# Patient Record
Sex: Male | Born: 1960 | Race: Black or African American | Hispanic: No | Marital: Married | Smoking: Never smoker
Health system: Southern US, Community
[De-identification: ages and names within clinical notes are randomized; demographics above are authoritative.]

## PROBLEM LIST (undated history)

## (undated) DIAGNOSIS — J45909 Unspecified asthma, uncomplicated: Secondary | ICD-10-CM

## (undated) HISTORY — DX: Unspecified asthma, uncomplicated: J45.909

## (undated) HISTORY — PX: NO PAST SURGERIES: SHX2092

## (undated) HISTORY — PX: KNEE ARTHROSCOPY: SHX127

---

## 1998-07-02 ENCOUNTER — Emergency Department (HOSPITAL_COMMUNITY): Admission: EM | Admit: 1998-07-02 | Discharge: 1998-07-02 | Payer: Self-pay | Admitting: Emergency Medicine

## 1998-08-06 ENCOUNTER — Emergency Department (HOSPITAL_COMMUNITY): Admission: EM | Admit: 1998-08-06 | Discharge: 1998-08-06 | Payer: Self-pay | Admitting: Emergency Medicine

## 1999-08-28 ENCOUNTER — Emergency Department (HOSPITAL_COMMUNITY): Admission: EM | Admit: 1999-08-28 | Discharge: 1999-08-28 | Payer: Self-pay | Admitting: Internal Medicine

## 1999-09-30 ENCOUNTER — Emergency Department (HOSPITAL_COMMUNITY): Admission: EM | Admit: 1999-09-30 | Discharge: 1999-09-30 | Payer: Self-pay | Admitting: Emergency Medicine

## 2000-06-19 ENCOUNTER — Emergency Department (HOSPITAL_COMMUNITY): Admission: EM | Admit: 2000-06-19 | Discharge: 2000-06-20 | Payer: Self-pay | Admitting: Emergency Medicine

## 2010-09-27 ENCOUNTER — Emergency Department (HOSPITAL_COMMUNITY)
Admission: EM | Admit: 2010-09-27 | Discharge: 2010-09-27 | Disposition: A | Discharge status: Home / Self Care | Payer: 59 | Attending: Emergency Medicine | Admitting: Emergency Medicine

## 2010-09-27 DIAGNOSIS — R21 Rash and other nonspecific skin eruption: Secondary | ICD-10-CM | POA: Insufficient documentation

## 2010-09-27 DIAGNOSIS — J45909 Unspecified asthma, uncomplicated: Secondary | ICD-10-CM | POA: Insufficient documentation

## 2012-07-26 ENCOUNTER — Ambulatory Visit (INDEPENDENT_AMBULATORY_CARE_PROVIDER_SITE_OTHER): Payer: 59 | Admitting: Internal Medicine

## 2012-07-26 ENCOUNTER — Encounter: Payer: Self-pay | Admitting: Internal Medicine

## 2012-07-26 VITALS — BP 102/70 | HR 78 | Temp 98.6°F | Ht 68.0 in | Wt 165.8 lb

## 2012-07-26 DIAGNOSIS — J45909 Unspecified asthma, uncomplicated: Secondary | ICD-10-CM

## 2012-07-26 MED ORDER — LEVOFLOXACIN 500 MG PO TABS: 500.0000 mg | ORAL_TABLET | Freq: Every day | ORAL | Status: DC

## 2012-07-26 MED ORDER — PREDNISONE 10 MG PO TABS: ORAL_TABLET | ORAL | Status: DC

## 2012-07-26 NOTE — Progress Notes (Signed)
Subjective:    Patient ID: Thomas [name], male    DOB: [birth year], 51 y.o.   MRN: [id number]  HPI  PCP is Baxter Hire, MD  IOV 07/26/2012  51 year old male. 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 Follows with Dr Willa Rough. Referred here because per him "Breathing scores need to be better". Says that breathing test numbers are bad. He has been seeing her for a few years and per him spirometry numbers are fluctuant but never is normal even when he feels well. He says he has diagnosis of asthma since infancy. He is on chronic symbicort (160/4.5) atleast 2 years. He says is 100% compliant. Takes xopenex prn which he atleast needs once a day even a good day. 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 Asthma symptoms are cough, dyspnea and wheeze. Asthma triggered by weather change, cold air, rain, snow, freezing weather, pollen, dust, perfume but not by gasoline or roaches. Symptoms improved by relaxation and medication.  Overall asthma stable for 2 years. Risk status: last ER visit 1994. Never admitted, No ICU stay.    Current Rx is symbicort and xopenex. Last prednisone one year ago but at baseline will need prednisone on average 3 times. He is not on allergy shots but says is positive for ragweed, and "other things" (tested > 5 years ago).  Of note, when I reviewed Dr Willa Rough notes: I have learned that he is supposed to be on singulair but he never decleared that as his med. He has been advised xolair but he denied any offering of anti allergy meds by Dr Willa Rough. In fact. Dr Willa Rough was extremely concerned about his non compliance. However, he maintains he is compliant  Currently having some cough that he thinks sick contact from his son x 1 week. There is associated sputum but not coughing out. There is also associated wheeze, sneezing occasionally.  No associated fever  Spriometry - Moderate obstruction  fev1 1.98L/62%, Ratio 61   Dr Gretta Cool Reflux Symptom Index (> 13-15 suggestive of LPR cough) 07/26/2012    Hoarseness of problem with voice 0  Clearing  Of Throat 0  Excess throat mucus or feeling of post nasal drip 0  Difficulty swallowing food, liquid or tablets 0  Cough after eating or lying down 0  Breathing difficulties or choking episodes 0  Troublesome or annoying cough 3  Sensation of something sticking in throat or lump in throat 3  Heartburn, chest pain, indigestion, or stomach acid coming up 0  TOTAL 6     Past Medical History  Diagnosis Date  . Asthma      No family history on file.   History   Social History  . Marital Status: Married    Spouse Name: N/A    Number of Children: N/A  . Years of Education: N/A   Occupational History  . truck driver    Social History Main Topics  . Smoking status: Never Smoker   . Smokeless tobacco: Not on file  . Alcohol Use: No  . Drug Use: No  . Sexually Active: Not on file   Other Topics Concern  . Not on file   Social History Narrative  . No narrative on file     No Known Allergies   No outpatient prescriptions prior to visit.    Last reviewed on 07/26/2012  3:16 PM by Darrell Jewel, CMA  Review of Systems  Constitutional: Negative for fever and unexpected weight change.  HENT: Negative for ear pain, nosebleeds, congestion, sore throat, rhinorrhea, sneezing, trouble swallowing, dental problem, postnasal drip and sinus pressure.   Eyes: Negative for redness and itching.  Respiratory: Positive for cough and shortness of breath. Negative for chest tightness and wheezing.   Cardiovascular: Negative for palpitations and leg swelling.  Gastrointestinal: Negative for nausea and vomiting.  Genitourinary: Negative for dysuria.  Musculoskeletal: Negative for joint swelling.  Skin: Negative for rash.  Neurological: Negative for headaches.  Hematological: Does not bruise/bleed easily.   Psychiatric/Behavioral: Negative for dysphoric mood. The patient is not nervous/anxious.        Objective:   Physical Exam  Nursing note and vitals reviewed. Constitutional: He is oriented to person, place, and time. He appears well-developed and well-nourished. No distress.  HENT:  Head: Normocephalic and atraumatic.  Right Ear: External ear normal.  Left Ear: External ear normal.  Mouth/Throat: Oropharynx is clear and moist. No oropharyngeal exudate.  Eyes: Conjunctivae normal and EOM are normal. Pupils are equal, round, and reactive to light. Right eye exhibits no discharge. Left eye exhibits no discharge. No scleral icterus.  Neck: Normal range of motion. Neck supple. No JVD present. No tracheal deviation present. No thyromegaly present.  Cardiovascular: Normal rate, regular rhythm and intact distal pulses.  Exam reveals no gallop and no friction rub.   No murmur heard. Pulmonary/Chest: Effort normal and breath sounds normal. No respiratory distress. He has no wheezes. He has no rales. He exhibits no tenderness.       barrell chest  Abdominal: Soft. Bowel sounds are normal. He exhibits no distension and no mass. There is no tenderness. There is no rebound and no guarding.  Musculoskeletal: Normal range of motion. He exhibits no edema and no tenderness.  Lymphadenopathy:    He has no cervical adenopathy.  Neurological: He is alert and oriented to person, place, and time. He has normal reflexes. No cranial nerve deficit. Coordination normal.  Skin: Skin is warm and dry. No rash noted. He is not diaphoretic. No erythema. No pallor.  Psychiatric: He has a normal mood and affect. His behavior is normal. Judgment and thought content normal.          Assessment & Plan:

## 2012-07-26 NOTE — Patient Instructions (Addendum)
You might be having an asthma attack due to sick exposure from your son  take levaquin 500mg  once daily  X 6 days Take prednisone 40 mg daily x 2 days, then 20mg  daily x 2 days, then 10mg  daily x 2 days, then 5mg  daily x 2 days and stop Stop symbicort Instead take dulera 200/5, 2 puff twice daily Use xopenex as needed Return in 1 month with spirometry at followup Please have flu shot ASAP; respect your decision to refuse today

## 2012-07-28 DIAGNOSIS — J45909 Unspecified asthma, uncomplicated: Secondary | ICD-10-CM | POA: Insufficient documentation

## 2012-07-28 NOTE — Assessment & Plan Note (Addendum)
He has moderate persistent asthma with poor control. REferral notes speak of poor compliance which he steadfasthly denies though for sure he is a poor historian (he could not remember conversations about xolair with Dr Willa Rough and could not clearly state whey he is here). I will Rx him for possible acute asthma with antibiotic and short course prednisone. For long term maintenance, will start with switching from symbicort 160 (moderate ICS)  To a high dose ICS (he opted out of advair so we went with dulera 200). I will see him at followup with spirometry   I offered flu shot but he refused saying he has never had it in past. Explained flu can be fatal in asthmatic. He then said he will think about it and likely have flu shot at followup

## 2012-08-30 ENCOUNTER — Encounter: Payer: Self-pay | Admitting: Internal Medicine

## 2012-08-30 ENCOUNTER — Ambulatory Visit (INDEPENDENT_AMBULATORY_CARE_PROVIDER_SITE_OTHER): Payer: 59 | Admitting: Internal Medicine

## 2012-08-30 VITALS — BP 110/72 | HR 71 | Temp 98.2°F | Ht 68.0 in | Wt 176.6 lb

## 2012-08-30 DIAGNOSIS — B370 Candidal stomatitis: Secondary | ICD-10-CM

## 2012-08-30 DIAGNOSIS — J45909 Unspecified asthma, uncomplicated: Secondary | ICD-10-CM

## 2012-08-30 MED ORDER — MONTELUKAST SODIUM 10 MG PO TABS: 10.0000 mg | ORAL_TABLET | Freq: Every day | ORAL | Status: DC

## 2012-08-30 MED ORDER — NYSTATIN 100000 UNIT/ML MT SUSP: 500000.0000 [IU] | Freq: Four times a day (QID) | OROMUCOSAL | Status: AC

## 2012-08-30 NOTE — Progress Notes (Signed)
Subjective:    Patient ID: Janos Shampine, male    DOB: Jun 30, 1961, 52 y.o.   MRN: [id number]  HPI PCP is Baxter Hire, MD  IOV 07/26/2012  52 year old male. 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 Follows with Dr Willa Rough. Referred here because per him "Breathing scores need to be better". Says that breathing test numbers are bad. He has been seeing her for a few years and per him spirometry numbers are fluctuant but never is normal even when he feels well. He says he has diagnosis of asthma since infancy. He is on chronic symbicort (160/4.5) atleast 2 years. He says is 100% compliant. Takes xopenex prn which he atleast needs once a day even a good day. 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 Asthma symptoms are cough, dyspnea and wheeze. Asthma triggered by weather change, cold air, rain, snow, freezing weather, pollen, dust, perfume but not by gasoline or roaches. Symptoms improved by relaxation and medication.  Overall asthma stable for 2 years. Risk status: last ER visit 1994. Never admitted, No ICU stay.    Current Rx is symbicort and xopenex. Last prednisone one year ago but at baseline will need prednisone on average 3 times. He is not on allergy shots but says is positive for ragweed, and "other things" (tested > 5 years ago).  Of note, when I reviewed Dr Willa Rough notes: I have learned that he is supposed to be on singulair but he never decleared that as his med. He has been advised xolair but he denied any offering of anti allergy meds by Dr Willa Rough. In fact. Dr Willa Rough was extremely concerned about his non compliance. However, he maintains he is compliant  Currently having some cough that he thinks sick contact from his son x 1 week. There is associated sputum but not coughing out. There is also associated wheeze, sneezing occasionally.  No associated fever  Spriometry - Moderate obstruction fev1  1.98L/62%, Ratio 61   Dr Gretta Cool Reflux Symptom Index (> 13-15 suggestive of LPR cough) 07/26/2012    Hoarseness of problem with voice 0  Clearing  Of Throat 0  Excess throat mucus or feeling of post nasal drip 0  Difficulty swallowing food, liquid or tablets 0  Cough after eating or lying down 0  Breathing difficulties or choking episodes 0  Troublesome or annoying cough 3  Sensation of something sticking in throat or lump in throat 3  Heartburn, chest pain, indigestion, or stomach acid coming up 0  TOTAL 6    REC You might be having an asthma attack due to sick exposure from your son  take levaquin 500mg  once daily X 6 days  Take prednisone 40 mg daily x 2 days, then 20mg  daily x 2 days, then 10mg  daily x 2 days, then 5mg  daily x 2 days and stop  Stop symbicort  Instead take dulera 200/5, 2 puff twice daily  Use xopenex as needed  Return in 1 month with spirometry at followup  Please have flu shot ASAP; respect your decision to refuse today   OV 08/30/2012  Followup Moderate persistent asthma   - he says after prednisone and change to dulera he is 80% better. HE hardly has any dyspnea, cough, wheeze, chest tightness. No sleep related awakenings. Denies nasal drainag or gerd Multiple times he told me he is compliantwith dulera. He is all smiles and happy about his improvement.  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 However, his spirometry is unchnaged - FEv1 is 1.97L/62%, R 59.  He has not had flu shot and he refuses this despite counseling several times even after I told hiim about h1n1 epidemic and dying patients in ICU esp in his age group with asthma  Past, Family, Social reviewed: no change since last visit    Review of Systems  Constitutional: Negative for fever and unexpected weight change.  HENT: Negative for ear pain, nosebleeds, congestion, sore throat, rhinorrhea, sneezing, trouble swallowing, dental  problem, postnasal drip and sinus pressure.   Eyes: Negative for redness and itching.  Respiratory: Negative for cough, chest tightness, shortness of breath and wheezing.   Cardiovascular: Negative for palpitations and leg swelling.  Gastrointestinal: Negative for nausea and vomiting.  Genitourinary: Negative for dysuria.  Musculoskeletal: Negative for joint swelling.  Skin: Negative for rash.  Neurological: Negative for headaches.  Hematological: Does not bruise/bleed easily.  Psychiatric/Behavioral: Negative for dysphoric mood. The patient is not nervous/anxious.    Current outpatient prescriptions:cetirizine (ZYRTEC) 10 MG tablet, Take 10 mg by mouth daily., Disp: , Rfl: ;  levalbuterol (XOPENEX HFA) 45 MCG/ACT inhaler, Inhale 1-2 puffs into the lungs every 4 (four) hours as needed., Disp: , Rfl: ;  mometasone-formoterol (DULERA) 200-5 MCG/ACT AERO, Inhale 2 puffs into the lungs 2 (two) times daily., Disp: , Rfl:      Objective:   Physical Exam Nursing note and vitals reviewed. Constitutional: He is oriented to person, place, and time. He appears well-developed and well-nourished. No distress.  HENT:  Head: Normocephalic and atraumatic.  Right Ear: External ear normal.  Left Ear: External ear normal.  Mouth/Throat: Oropharynx is clear and moist.  HE HAS THRUSH in oral pharynx Eyes: Conjunctivae normal and EOM are normal. Pupils are equal, round, and reactive to light. Right eye exhibits no discharge. Left eye exhibits no discharge. No scleral icterus.  Neck: Normal range of motion. Neck supple. No JVD present. No tracheal deviation present. No thyromegaly present.  Cardiovascular: Normal rate, regular rhythm and intact distal pulses.  Exam reveals no gallop and no friction rub.   No murmur heard. Pulmonary/Chest: Effort normal and breath sounds normal. No respiratory distress. He has no wheezes. He has no rales. He exhibits no tenderness.       barrell chest  Abdominal: Soft. Bowel  sounds are normal. He exhibits no distension and no mass. There is no tenderness. There is no rebound and no guarding.  Musculoskeletal: Normal range of motion. He exhibits no edema and no tenderness.  Lymphadenopathy:    He has no cervical adenopathy.  Neurological: He is alert and oriented to person, place, and time. He has normal reflexes. No cranial nerve deficit. Coordination normal.  Skin: Skin is warm and dry. No rash noted. He is not diaphoretic. No erythema. No pallor.  Psychiatric: He has a normal mood and affect. His behavior is normal. Judgment and thought content normal.           Assessment & Plan:

## 2012-08-30 NOTE — Assessment & Plan Note (Signed)
#   Oral thrush - For Oral thrush: Take Nystatin Suspension (swish and swallow): 500,000 units 4 times/day for 5 days; swish in the mouth and retain for as long as possible (several minutes) before swallowing - CMA will ensure dulera technique is good. IF this fails, will consider spacer  - use listerine mouth wash rinse and gargle to back of throat after every use of dulera

## 2012-08-30 NOTE — Patient Instructions (Addendum)
#  asthma  - you are better subjectively but objectively no better on testing. - assuming you are taking mediciations correctly this could be due to remodeling of asthma otherwise called fixed asthma  - continue dulera 2 puff twice daily at current dose - we will add singulair 10mg  one tablet daily at night to see if this can help you further - use albuterol as needed especially 5 min before heavy exertion but not more than 4 times a day You absolutely need to take flu shot; the flu season this year is bad esp on people in your age group with asthma; they are dying from it. So, please have flu shot asap  ## Oral thrush - For Oral thrush: Take Nystatin Suspension (swish and swallow): 500,000 units 4 times/day for 5 days; swish in the mouth and retain for as long as possible (several minutes) before swallowing - CMA will ensure dulera technique is good  - use listerine mouth wash rinse and gargle to back of throat after every use of dulera  #Followup  3 months or sooner if neded Spirometry at followup

## 2012-08-30 NOTE — Assessment & Plan Note (Signed)
#  asthma  - you are better subjectively but objectively no better on testing. - assuming you are taking mediciations correctly this could be due to remodeling of asthma otherwise called fixed asthma  - continue dulera 2 puff twice daily at current dose - we will add singulair 10mg  one tablet daily at night to see if this can help you further - use albuterol as needed especially 5 min before heavy exertion but not more than 4 times a day You absolutely need to take flu shot; the flu season this year is bad esp on people in your age group with asthma; they are dying from it. So, please have flu shot asap  # #Followup  3 months or sooner if neded Spirometry at followup

## 2012-12-14 ENCOUNTER — Encounter: Payer: Self-pay | Admitting: Internal Medicine

## 2012-12-14 ENCOUNTER — Ambulatory Visit (INDEPENDENT_AMBULATORY_CARE_PROVIDER_SITE_OTHER): Payer: 59 | Admitting: Internal Medicine

## 2012-12-14 VITALS — BP 110/82 | HR 78 | Temp 98.3°F | Ht 68.0 in | Wt 184.4 lb

## 2012-12-14 DIAGNOSIS — B370 Candidal stomatitis: Secondary | ICD-10-CM

## 2012-12-14 DIAGNOSIS — L509 Urticaria, unspecified: Secondary | ICD-10-CM

## 2012-12-14 DIAGNOSIS — J45901 Unspecified asthma with (acute) exacerbation: Secondary | ICD-10-CM

## 2012-12-14 DIAGNOSIS — J45909 Unspecified asthma, uncomplicated: Secondary | ICD-10-CM

## 2012-12-14 MED ORDER — NYSTATIN 100000 UNIT/ML MT SUSP: 500000.0000 [IU] | Freq: Four times a day (QID) | OROMUCOSAL | Status: DC

## 2012-12-14 NOTE — Progress Notes (Signed)
Subjective:    Patient ID: Thomas [name], male    DOB: 09-04-1960, 52 y.o.   MRN: [id number]  HPI PCP is Baxter Hire, MD  IOV 07/26/2012  52 year old male. 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 Follows with Dr Willa Rough. Referred here because per him "Breathing scores need to be better". Says that breathing test numbers are bad. He has been seeing her for a few years and per him spirometry numbers are fluctuant but never is normal even when he feels well. He says he has diagnosis of asthma since infancy. He is on chronic symbicort (160/4.5) atleast 2 years. He says is 100% compliant. Takes xopenex prn which he atleast needs once a day even a good day. 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 Asthma symptoms are cough, dyspnea and wheeze. Asthma triggered by weather change, cold air, rain, snow, freezing weather, pollen, dust, perfume but not by gasoline or roaches. Symptoms improved by relaxation and medication.  Overall asthma stable for 2 years. Risk status: last ER visit 1994. Never admitted, No ICU stay.    Current Rx is symbicort and xopenex. Last prednisone one year ago but at baseline will need prednisone on average 3 times. He is not on allergy shots but says is positive for ragweed, and "other things" (tested > 5 years ago).  Of note, when I reviewed Dr Willa Rough notes: I have learned that he is supposed to be on singulair but he never decleared that as his med. He has been advised xolair but he denied any offering of anti allergy meds by Dr Willa Rough. In fact. Dr Willa Rough was extremely concerned about his non compliance. However, he maintains he is compliant  Currently having some cough that he thinks sick contact from his son x 1 week. There is associated sputum but not coughing out. There is also associated wheeze, sneezing occasionally.  No associated fever  Spriometry - Moderate obstruction fev1  1.98L/62%, Ratio 61   REC You might be having an asthma attack due to sick exposure from your son  take levaquin 500mg  once daily X 6 days  Take prednisone 40 mg daily x 2 days, then 20mg  daily x 2 days, then 10mg  daily x 2 days, then 5mg  daily x 2 days and stop  Stop symbicort  Instead take dulera 200/5, 2 puff twice daily  Use xopenex as needed  Return in 1 month with spirometry at followup  Please have flu shot ASAP; respect your decision to refuse today   OV 08/30/2012  Followup Moderate persistent asthma   - he says after prednisone and change to dulera he is 80% better. HE hardly has any dyspnea, cough, wheeze, chest tightness. No sleep related awakenings. Denies nasal drainag or gerd Multiple times he told me he is compliantwith dulera. He is all smiles and happy about his improvement. 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 However, his spirometry is unchnaged - FEv1 is 1.97L/62%, R 59.  He has not had flu shot and he refuses this despite counseling several times even after I told hiim about h1n1 epidemic and dying patients in ICU esp in his age group with asthma  Past, Family, Social reviewed: no change since last visit  #asthma  - you are better subjectively but objectively no better on testing. - assuming you are taking mediciations correctly  this could be due to remodeling of asthma otherwise called fixed asthma  - continue dulera 2 puff twice daily at current dose  - we will add singulair 10mg  one tablet daily at night to see if this can help you further  - use albuterol as needed especially 5 min before heavy exertion but not more than 4 times a day  You absolutely need to take flu shot; the flu season this year is bad esp on people in your age group with asthma; they are dying from it. So, please have flu shot asap   ## Oral thrush  - For Oral thrush: Take Nystatin Suspension (swish and swallow): 500,000 units 4  times/day for 5 days; swish in the mouth and retain for as long as possible (several minutes) before swallowing  - CMA will ensure dulera technique is good  - use listerine mouth wash rinse and gargle to back of throat after every use of dulera   #Followup  3 months or sooner if neded  Spirometry at followup    OV 12/14/2012  Followup moderate persistent asthma and thrush  - Since last visit he says his respiratory status is excellent. He says that he is compliant with his Dulera 2 puff 2 times daily and also Singulair. He says that his albuterol rescue use is rare and there are no nocturnal awakenings. He denies all wheeze. Spirometry today shows FEV1 2.18 L/68%. Ratio 65 and is unchanged from prior on may be a little bit better   - In terms of oral thrush: He says that he did not get a prescription for nystatin swish and swallow. He does not remember the fact that we discussed that he had oral Trush at last visit. He currently has a thrush. He says that he rinses his mouth with hydrogen peroxide diligently  - He has a new complaint of red urticarial like rash in his armpits and is just on and off at base times in the day. They're moderate in intensity. It is associated with itching. They come and go. They're not associated with worsening respiratory status. This is new    Review of Systems  Constitutional: Negative for fever and unexpected weight change.  HENT: Negative for ear pain, nosebleeds, congestion, sore throat, rhinorrhea, sneezing, trouble swallowing, dental problem, postnasal drip and sinus pressure.   Eyes: Negative for redness and itching.  Respiratory: Negative for cough, chest tightness, shortness of breath and wheezing.   Cardiovascular: Negative for palpitations and leg swelling.  Gastrointestinal: Negative for nausea and vomiting.  Genitourinary: Negative for dysuria.  Musculoskeletal: Negative for joint swelling.  Skin: Positive for rash.  Neurological: Negative  for headaches.  Hematological: Does not bruise/bleed easily.  Psychiatric/Behavioral: Negative for dysphoric mood. The patient is not nervous/anxious.    Current outpatient prescriptions:cetirizine (ZYRTEC) 10 MG tablet, Take 10 mg by mouth daily., Disp: , Rfl: ;  levalbuterol (XOPENEX HFA) 45 MCG/ACT inhaler, Inhale 1-2 puffs into the lungs every 4 (four) hours as needed., Disp: , Rfl: ;  mometasone-formoterol (DULERA) 200-5 MCG/ACT AERO, Inhale 2 puffs into the lungs 2 (two) times daily., Disp: , Rfl:  montelukast (SINGULAIR) 10 MG tablet, Take 1 tablet (10 mg total) by mouth at bedtime., Disp: 30 tablet, Rfl: 11     Objective:   Physical Exam Nursing note and vitals reviewed. Constitutional: He is oriented to person, place, and time. He appears well-developed and well-nourished. No distress.  HENT:  Head: Normocephalic and atraumatic.  Right Ear: External ear normal.  Left Ear: External ear normal.  Mouth/Throat: Oropharynx is clear and moist.  HE HAS THRUSH in oral pharynx Eyes: Conjunctivae normal and EOM are normal. Pupils are equal, round, and reactive to light. Right eye exhibits no discharge. Left eye exhibits no discharge. No scleral icterus.  Neck: Normal range of motion. Neck supple. No JVD present. No tracheal deviation present. No thyromegaly present.  Cardiovascular: Normal rate, regular rhythm and intact distal pulses.  Exam reveals no gallop and no friction rub.   No murmur heard. Pulmonary/Chest: Effort normal and breath sounds normal. No respiratory distress. He has no wheezes. He has no rales. He exhibits no tenderness.       barrell chest  Abdominal: Soft. Bowel sounds are normal. He exhibits no distension and no mass. There is no tenderness. There is no rebound and no guarding.  Musculoskeletal: Normal range of motion. He exhibits no edema and no tenderness.  Lymphadenopathy:    He has no cervical adenopathy.  Neurological: He is alert and oriented to person, place,  and time. He has normal reflexes. No cranial nerve deficit. Coordination normal.  Skin: Skin is warm and dry. No rash noted. He is not diaphoretic. No erythema. No pallor.  he has some urticarial rash in his bilateral infraclavicular area  Psychiatric: He has a normal mood and affect. His behavior is normal. Judgment and thought content normal.         Assessment & Plan:

## 2012-12-14 NOTE — Patient Instructions (Addendum)
#  Oral thrush  - - For Oral thrush: Take Suspension (swish and swallow): 500,000 units 4 times/day for 5 days; swish in the mouth and retain for as long as possible (several minutes) before swallowing - do blood test today  - demonstrate inhaler technique to my CMA   #SKin rash  - refer dermatology  #Asthma  -Your asthma stable on breathing test - Continue Dulera and Singulair as before - Am arranging for an exam nitric oxide test at [facility]; you might have to pay $25 out of pocket for this test. Do this next few weeks before you see me back   #FOllowup  -  1 month  - spirometry at followup

## 2012-12-15 DIAGNOSIS — L509 Urticaria, unspecified: Secondary | ICD-10-CM | POA: Insufficient documentation

## 2012-12-15 NOTE — Assessment & Plan Note (Addendum)
#  Asthma  -Your asthma stable on breathing test but I am still unsure about compliance - Continue Dulera and Singulair as before; I would recheck this inhaler technique to my medical assistant - To test compliance/control, arrange exam nitric oxide test at [facility] allergy Center; explained he might have to pay $25 out of pocket for this test.  - Discussed nitric oxide testing coordination with Dr. Aris [place] [facility]  #FOllowup  -  1 month  - spirometry at followup

## 2012-12-15 NOTE — Assessment & Plan Note (Addendum)
He is persistent oral thrush. He had this at last visit in January 2014 as well. At that visit I prescribed him nystatin swish and swallow but he says that nobody did and he did not get a prescription. Therefore we'll repeat nystatin swish and swallow. We'll also check HIV statu

## 2012-12-15 NOTE — Assessment & Plan Note (Signed)
Possibly allergy mediated. We'll refer him to dermatology

## 2012-12-29 ENCOUNTER — Telehealth: Payer: Self-pay | Admitting: Internal Medicine

## 2012-12-29 NOTE — Telephone Encounter (Signed)
Exhaled ntiric oxide at [facility] is  9ppb - LOW /Well controleld airway inflammation. TEst done on 12/20/12   Dr. Kalman Shan, M.D., [facility].C.P Pulmonary and Critical Care Medicine Staff Physician [facility] Pager: [phone], If no answer or between  15:00h - 7:00h: call [phone]  12/29/2012 6:28 PM

## 2013-01-13 ENCOUNTER — Ambulatory Visit (INDEPENDENT_AMBULATORY_CARE_PROVIDER_SITE_OTHER): Payer: 59 | Admitting: Internal Medicine

## 2013-01-13 ENCOUNTER — Encounter: Payer: Self-pay | Admitting: Internal Medicine

## 2013-01-13 VITALS — BP 130/76 | HR 71 | Ht 68.0 in | Wt 179.2 lb

## 2013-01-13 DIAGNOSIS — B370 Candidal stomatitis: Secondary | ICD-10-CM

## 2013-01-13 DIAGNOSIS — J45909 Unspecified asthma, uncomplicated: Secondary | ICD-10-CM

## 2013-01-13 DIAGNOSIS — L509 Urticaria, unspecified: Secondary | ICD-10-CM

## 2013-01-13 DIAGNOSIS — J4540 Moderate persistent asthma, uncomplicated: Secondary | ICD-10-CM

## 2013-01-13 DIAGNOSIS — J449 Chronic obstructive pulmonary disease, unspecified: Secondary | ICD-10-CM

## 2013-01-13 DIAGNOSIS — R0600 Dyspnea, unspecified: Secondary | ICD-10-CM

## 2013-01-13 MED ORDER — FLUCONAZOLE 100 MG PO TABS: 100.0000 mg | ORAL_TABLET | Freq: Every day | ORAL | Status: DC

## 2013-01-13 MED ORDER — TIOTROPIUM BROMIDE MONOHYDRATE 18 MCG IN CAPS: 18.0000 ug | ORAL_CAPSULE | Freq: Every day | RESPIRATORY_TRACT | Status: DC

## 2013-01-13 NOTE — Progress Notes (Signed)
Subjective:    Patient ID: Thomas [name], male    DOB: Apr 23, 1961, 52 y.o.   MRN: [id number]  HPI PCP is Baxter Hire, MD  IOV 07/26/2012  52 year old male. 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 Follows with Dr Willa Rough. Referred here because per him "Breathing scores need to be better". Says that breathing test numbers are bad. He has been seeing her for a few years and per him spirometry numbers are fluctuant but never is normal even when he feels well. He says he has diagnosis of asthma since infancy. He is on chronic symbicort (160/4.5) atleast 2 years. He says is 100% compliant. Takes xopenex prn which he atleast needs once a day even a good day. 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 Asthma symptoms are cough, dyspnea and wheeze. Asthma triggered by weather change, cold air, rain, snow, freezing weather, pollen, dust, perfume but not by gasoline or roaches. Symptoms improved by relaxation and medication.  Overall asthma stable for 2 years. Risk status: last ER visit 1994. Never admitted, No ICU stay.    Current Rx is symbicort and xopenex. Last prednisone one year ago but at baseline will need prednisone on average 3 times. He is not on allergy shots but says is positive for ragweed, and "other things" (tested > 5 years ago).  Of note, when I reviewed Dr Willa Rough notes: I have learned that he is supposed to be on singulair but he never decleared that as his med. He has been advised xolair but he denied any offering of anti allergy meds by Dr Willa Rough. In fact. Dr Willa Rough was extremely concerned about his non compliance. However, he maintains he is compliant  Currently having some cough that he thinks sick contact from his son x 1 week. There is associated sputum but not coughing out. There is also associated wheeze, sneezing occasionally.  No associated fever  Spriometry - Moderate obstruction fev1  1.98L/62%, Ratio 61   REC You might be having an asthma attack due to sick exposure from your son  take levaquin 500mg  once daily X 6 days  Take prednisone 40 mg daily x 2 days, then 20mg  daily x 2 days, then 10mg  daily x 2 days, then 5mg  daily x 2 days and stop  Stop symbicort  Instead take dulera 200/5, 2 puff twice daily  Use xopenex as needed  Return in 1 month with spirometry at followup  Please have flu shot ASAP; respect your decision to refuse today   OV 08/30/2012  Followup Moderate persistent asthma   - he says after prednisone and change to dulera he is 80% better. HE hardly has any dyspnea, cough, wheeze, chest tightness. No sleep related awakenings. Denies nasal drainag or gerd Multiple times he told me he is compliantwith dulera. He is all smiles and happy about his improvement. 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 However, his spirometry is unchnaged - FEv1 is 1.97L/62%, R 59.  He has not had flu shot and he refuses this despite counseling several times even after I told hiim about h1n1 epidemic and dying patients in ICU esp in his age group with asthma  Past, Family, Social reviewed: no change since last visit  #asthma  - you are better subjectively but objectively no better on testing. - assuming you are taking mediciations correctly  this could be due to remodeling of asthma otherwise called fixed asthma  - continue dulera 2 puff twice daily at current dose  - we will add singulair 10mg  one tablet daily at night to see if this can help you further  - use albuterol as needed especially 5 min before heavy exertion but not more than 4 times a day  You absolutely need to take flu shot; the flu season this year is bad esp on people in your age group with asthma; they are dying from it. So, please have flu shot asap   ## Oral thrush  - For Oral thrush: Take Nystatin Suspension (swish and swallow): 500,000 units 4  times/day for 5 days; swish in the mouth and retain for as long as possible (several minutes) before swallowing  - CMA will ensure dulera technique is good  - use listerine mouth wash rinse and gargle to back of throat after every use of dulera   #Followup  3 months or sooner if neded  Spirometry at followup    OV 12/14/2012  Followup moderate persistent asthma and thrush  - Since last visit he says his respiratory status is excellent. He says that he is compliant with his Dulera 2 puff 2 times daily and also Singulair. He says that his albuterol rescue use is rare and there are no nocturnal awakenings. He denies all wheeze. Spirometry today shows FEV1 2.18 L/68%. Ratio 65 and is unchanged from prior on may be a little bit better   - In terms of oral thrush: He says that he did not get a prescription for nystatin swish and swallow. He does not remember the fact that we discussed that he had oral Trush at last visit. He currently has a thrush. He says that he rinses his mouth with hydrogen peroxide diligently  - He has a new complaint of red urticarial like rash in his armpits and is just on and off at base times in the day. They're moderate in intensity. It is associated with itching. They come and go. They're not associated with worsening respiratory status. This is new  #Oral thrush  -  - For Oral thrush: Take Suspension (swish and swallow): 500,000 units 4 times/day for 5 days; swish in the mouth and retain for as long as possible (several minutes) before swallowing  - do blood test today  - demonstrate inhaler technique to my CMA  #SKin rash  - refer dermatology  #Asthma  -Your asthma stable on breathing test  - Continue Dulera and Singulair as before  - Am arranging for an exam nitric oxide test at [facility] allergy Center; you might have to pay $25 out of pocket for this test. Do this next few weeks before you see me back  #FOllowup  - 1 month  - spirometry at  followup    Review of Systems  Constitutional: Negative for fever and unexpected weight change.  HENT: Negative for ear pain, nosebleeds, congestion, sore throat, rhinorrhea, sneezing, trouble swallowing, dental problem, postnasal drip and sinus pressure.   Eyes: Negative for redness and itching.  Respiratory: Negative for cough, chest tightness, shortness of breath and wheezing.   Cardiovascular: Negative for palpitations and leg swelling.  Gastrointestinal: Negative for nausea and vomiting.  Genitourinary: Negative for dysuria.  Musculoskeletal: Negative for joint swelling.  Skin: Negative for rash.  Neurological: Negative for headaches.  Hematological: Does not bruise/bleed easily.  Psychiatric/Behavioral: Negative for dysphoric mood. The patient is not nervous/anxious.  Objective:   Physical Exam Nursing note and vitals reviewed. Constitutional: He is oriented to person, place, and time. He appears well-developed and well-nourished. No distress.  HENT:  Head: Normocephalic and atraumatic.  Right Ear: External ear normal.  Left Ear: External ear normal.  Mouth/Throat: Oropharynx is clear and moist.  HE HAS THRUSH in oral pharynx Eyes: Conjunctivae normal and EOM are normal. Pupils are equal, round, and reactive to light. Right eye exhibits no discharge. Left eye exhibits no discharge. No scleral icterus.  Neck: Normal range of motion. Neck supple. No JVD present. No tracheal deviation present. No thyromegaly present.  Cardiovascular: Normal rate, regular rhythm and intact distal pulses.  Exam reveals no gallop and no friction rub.   No murmur heard. Pulmonary/Chest: Effort normal and breath sounds normal. No respiratory distress. He has no wheezes. He has no rales. He exhibits no tenderness.       barrell chest  Abdominal: Soft. Bowel sounds are normal. He exhibits no distension and no mass. There is no tenderness. There is no rebound and no guarding.  Musculoskeletal:  Normal range of motion. He exhibits no edema and no tenderness.  Lymphadenopathy:    He has no cervical adenopathy.  Neurological: He is alert and oriented to person, place, and time. He has normal reflexes. No cranial nerve deficit. Coordination normal.  Skin: Skin is warm and dry. No rash noted. He is not diaphoretic. No erythema. No pallor.  he has some urticarial rash in his bilateral infraclavicular area  Psychiatric: He has a normal mood and affect. His behavior is normal. Judgment and thought content normal.          Assessment & Plan:

## 2013-01-13 NOTE — Progress Notes (Signed)
Subjective:    Patient ID: Thomas [name], male    DOB: 09-Aug-1961, 52 y.o.   MRN: [id number]  HPI  PI PCP is Baxter Hire, MD  IOV 07/26/2012  52 year old male. 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 Follows with Dr Willa Rough. Referred here because per him "Breathing scores need to be better". Says that breathing test numbers are bad. He has been seeing her for a few years and per him spirometry numbers are fluctuant but never is normal even when he feels well. He says he has diagnosis of asthma since infancy. He is on chronic symbicort (160/4.5) atleast 2 years. He says is 100% compliant. Takes xopenex prn which he atleast needs once a day even a good day. 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 Asthma symptoms are cough, dyspnea and wheeze. Asthma triggered by weather change, cold air, rain, snow, freezing weather, pollen, dust, perfume but not by gasoline or roaches. Symptoms improved by relaxation and medication.  Overall asthma stable for 2 years. Risk status: last ER visit 1994. Never admitted, No ICU stay.    Current Rx is symbicort and xopenex. Last prednisone one year ago but at baseline will need prednisone on average 3 times. He is not on allergy shots but says is positive for ragweed, and "other things" (tested > 5 years ago).  Of note, when I reviewed Dr Willa Rough notes: I have learned that he is supposed to be on singulair but he never decleared that as his med. He has been advised xolair but he denied any offering of anti allergy meds by Dr Willa Rough. In fact. Dr Willa Rough was extremely concerned about his non compliance. However, he maintains he is compliant  Currently having some cough that he thinks sick contact from his son x 1 week. There is associated sputum but not coughing out. There is also associated wheeze, sneezing occasionally.  No associated fever  Spriometry - Moderate obstruction  fev1 1.98L/62%, Ratio 61   REC You might be having an asthma attack due to sick exposure from your son  take levaquin 500mg  once daily X 6 days  Take prednisone 40 mg daily x 2 days, then 20mg  daily x 2 days, then 10mg  daily x 2 days, then 5mg  daily x 2 days and stop  Stop symbicort  Instead take dulera 200/5, 2 puff twice daily  Use xopenex as needed  Return in 1 month with spirometry at followup  Please have flu shot ASAP; respect your decision to refuse today   OV 08/30/2012  Followup Moderate persistent asthma   - he says after prednisone and change to dulera he is 80% better. HE hardly has any dyspnea, cough, wheeze, chest tightness. No sleep related awakenings. Denies nasal drainag or gerd Multiple times he told me he is compliantwith dulera. He is all smiles and happy about his improvement. 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 However, his spirometry is unchnaged - FEv1 is 1.97L/62%, R 59.  He has not had flu shot and he refuses this despite counseling several times even after I told hiim about h1n1 epidemic and dying patients in ICU esp in his age group with asthma  Past, Family, Social reviewed: no change since last visit  #asthma  - you are better subjectively but objectively no better on testing. - assuming you are taking  mediciations correctly this could be due to remodeling of asthma otherwise called fixed asthma  - continue dulera 2 puff twice daily at current dose  - we will add singulair 10mg  one tablet daily at night to see if this can help you further  - use albuterol as needed especially 5 min before heavy exertion but not more than 4 times a day  You absolutely need to take flu shot; the flu season this year is bad esp on people in your age group with asthma; they are dying from it. So, please have flu shot asap   ## Oral thrush  - For Oral thrush: Take Nystatin Suspension (swish and swallow): 500,000 units  4 times/day for 5 days; swish in the mouth and retain for as long as possible (several minutes) before swallowing  - CMA will ensure dulera technique is good  - use listerine mouth wash rinse and gargle to back of throat after every use of dulera   #Followup  3 months or sooner if neded  Spirometry at followup    OV 12/14/2012  Followup moderate persistent asthma and thrush  - Since last visit he says his respiratory status is excellent. He says that he is compliant with his Dulera 2 puff 2 times daily and also Singulair. He says that his albuterol rescue use is rare and there are no nocturnal awakenings. He denies all wheeze. Spirometry today shows FEV1 2.18 L/68%. Ratio 65 and is unchanged from prior on may be a little bit better   - In terms of oral thrush: He says that he did not get a prescription for nystatin swish and swallow. He does not remember the fact that we discussed that he had oral Trush at last visit. He currently has a thrush. He says that he rinses his mouth with hydrogen peroxide diligently  - He has a new complaint of red urticarial like rash in his armpits and is just on and off at base times in the day. They're moderate in intensity. It is associated with itching. They come and go. They're not associated with worsening respiratory status. This is new   REC  #Oral thrush  - - For Oral thrush: Take Suspension (swish and swallow): 500,000 units 4 times/day for 5 days; swish in the mouth and retain for as long as possible (several minutes) before swallowing - do blood test today  - demonstrate inhaler technique to my CMA   #SKin rash  - refer dermatology  #Asthma  -Your asthma stable on breathing test - Continue Dulera and Singulair as before - Am arranging for an exam nitric oxide test at [facility]; you might have to pay $25 out of pocket for this test. Do this next few weeks before you see me back   #FOllowup  -  1 month  - spirometry at  followup   OV 01/13/2013  Followup asthma and thrush  - He currently feels well. He again insists he is compliant with the Singulair and Dulera and does not miss a single dose. He reports mild intermittent albuterol use and very well controlled symptoms today and since last visit. Consistent with this is Exam nitric oxide test done 12/20/2012 was 9 parts suggesting good asthma control (done at [facility]]. However, Spirometry today shows FEV11.56 L/50% with ratio 55. The spirometry is worse than his baseline of 62%.  - In terms of his rash: He saw dermatology presumably  [facility]. He says biopsies were negative. They've advised  expectant follow up in is not happy about that  - In terms of thrush: He says when he saw the dermatologist it was not there but today on exam some thrush is that in the left pharyngeal pillar and tonsillar fossa   Review of Systems  Constitutional: Negative for fever and unexpected weight change.  HENT: Negative for ear pain, nosebleeds, congestion, sore throat, rhinorrhea, sneezing, trouble swallowing, dental problem, postnasal drip and sinus pressure.   Eyes: Negative for redness and itching.  Respiratory: Negative for cough, chest tightness, shortness of breath and wheezing.   Cardiovascular: Negative for palpitations and leg swelling.  Gastrointestinal: Negative for nausea and vomiting.  Genitourinary: Negative for dysuria.  Musculoskeletal: Negative for joint swelling.  Skin: Negative for rash.  Neurological: Negative for headaches.  Hematological: Does not bruise/bleed easily.  Psychiatric/Behavioral: Negative for dysphoric mood. The patient is not nervous/anxious.        Objective:   Physical Exam  Physical Exam Nursing note and vitals reviewed. Constitutional: He is oriented to person, place, and time. He appears well-developed and well-nourished. No distress.  HENT:  Head: Normocephalic and atraumatic.  Right Ear:  External ear normal.  Left Ear: External ear normal.  Mouth/Throat: Oropharynx is clear and moist.  HE still HAS THRUSH in oral pharynx; improved Eyes: Conjunctivae normal and EOM are normal. Pupils are equal, round, and reactive to light. Right eye exhibits no discharge. Left eye exhibits no discharge. No scleral icterus.  Neck: Normal range of motion. Neck supple. No JVD present. No tracheal deviation present. No thyromegaly present.  Cardiovascular: Normal rate, regular rhythm and intact distal pulses.  Exam reveals no gallop and no friction rub.   No murmur heard. Pulmonary/Chest: Effort normal and breath sounds normal. No respiratory distress. He has no wheezes. He has no rales. He exhibits no tenderness.       barrell chest  Abdominal: Soft. Bowel sounds are normal. He exhibits no distension and no mass. There is no tenderness. There is no rebound and no guarding.  Musculoskeletal: Normal range of motion. He exhibits no edema and no tenderness.  Lymphadenopathy:    He has no cervical adenopathy.  Neurological: He is alert and oriented to person, place, and time. He has normal reflexes. No cranial nerve deficit. Coordination normal.  Skin: Skin is warm and dry. No rash noted. He is not diaphoretic. No erythema. No pallor.  urticarial rash in his bilateral infraclavicular area has resolved Psychiatric: He has a normal mood and affect. His behavior is normal. Judgment and thought content normal.          Assessment & Plan:

## 2013-01-13 NOTE — Patient Instructions (Addendum)
#  Asthma -not sure why you have persistent moderate obstruction despite exam nitric oxide being normal - Continue Dulera and Singulair as before - Start Spiriva 1 puff daily; learn technique from  medical assistant - To ensure lung tissue is normal get a CT scan of the chest  - You might have insurance hurdles for this but we will see - Depending on the above you might need referral to [facility]  #Thrush - This is still present - Take oral Diflucan 100 mg once daily for 7 days  #Rash - Follow dermatologist advise  #Followup - 6 weeks with spirometry at followup

## 2013-01-15 NOTE — Assessment & Plan Note (Signed)
#  Asthma -not sure why you have persistent moderate obstruction despite exam nitric oxide being normal - Continue Dulera and Singulair as before - Start Ginette Pitman - This is still present - Take oral Diflucan 100 mg once daily for 7 days #Followup - 6 weeks with spirometry at followup

## 2013-01-15 NOTE — Assessment & Plan Note (Signed)
Per dermatologist

## 2013-01-15 NOTE — Assessment & Plan Note (Signed)
#  Rash Spiriva 1 puff daily; learn technique from  medical assistant - To ensure lung tissue is normal get a CT scan of the chest  - You might have insurance hurdles for this but we will see - Depending on the above you might need referral to [facility]  #- Follow dermatologist advise

## 2013-01-19 ENCOUNTER — Inpatient Hospital Stay: Admission: RE | Admit: 2013-01-19 | Payer: 59 | Source: Ambulatory Visit

## 2013-02-24 ENCOUNTER — Ambulatory Visit: Payer: 59 | Admitting: Internal Medicine

## 2013-04-25 ENCOUNTER — Ambulatory Visit: Payer: 59 | Admitting: Internal Medicine

## 2015-07-10 ENCOUNTER — Telehealth: Payer: Self-pay | Admitting: Allergy and Immunology

## 2015-07-10 NOTE — Telephone Encounter (Signed)
Pt asking for samples of Dulera and Xopanex. pls advise

## 2015-07-11 NOTE — Telephone Encounter (Signed)
Called and spoke with patient. Advised that we do not have xopenex samples but I could leave him one dulera sample at the front. Patient also scheduled an appointment to see Dr Willa RoughHicks since last ov was 10-12-14.

## 2015-07-19 ENCOUNTER — Ambulatory Visit: Payer: Self-pay | Admitting: Allergy and Immunology

## 2015-08-22 ENCOUNTER — Ambulatory Visit: Payer: Self-pay | Admitting: Allergy and Immunology

## 2016-11-26 ENCOUNTER — Telehealth: Payer: Self-pay | Admitting: Allergy

## 2016-11-26 NOTE — Telephone Encounter (Signed)
Left message to return call. Patient needs office visit last ov 10/13/14 will not give samples

## 2016-11-26 NOTE — Telephone Encounter (Signed)
patient would like some samples of DULERA and XOPENEX

## 2016-11-27 NOTE — Telephone Encounter (Signed)
Left message to call office

## 2016-12-10 ENCOUNTER — Ambulatory Visit: Payer: Self-pay | Admitting: Allergy

## 2018-08-19 ENCOUNTER — Emergency Department (HOSPITAL_BASED_OUTPATIENT_CLINIC_OR_DEPARTMENT_OTHER)
Admission: EM | Admit: 2018-08-19 | Discharge: 2018-08-19 | Disposition: A | Discharge status: Home / Self Care | Payer: PRIVATE HEALTH INSURANCE | Attending: Emergency Medicine | Admitting: Emergency Medicine

## 2018-08-19 ENCOUNTER — Other Ambulatory Visit: Payer: Self-pay

## 2018-08-19 ENCOUNTER — Emergency Department (HOSPITAL_BASED_OUTPATIENT_CLINIC_OR_DEPARTMENT_OTHER): Payer: PRIVATE HEALTH INSURANCE

## 2018-08-19 ENCOUNTER — Encounter (HOSPITAL_BASED_OUTPATIENT_CLINIC_OR_DEPARTMENT_OTHER): Payer: Self-pay | Admitting: Emergency Medicine

## 2018-08-19 DIAGNOSIS — J209 Acute bronchitis, unspecified: Secondary | ICD-10-CM | POA: Insufficient documentation

## 2018-08-19 DIAGNOSIS — R0602 Shortness of breath: Secondary | ICD-10-CM | POA: Diagnosis present

## 2018-08-19 MED ORDER — FLUTICASONE PROPIONATE HFA 44 MCG/ACT IN AERO
2.0000 | INHALATION_SPRAY | Freq: Two times a day (BID) | RESPIRATORY_TRACT | Status: DC
  Administered 2018-08-19: 2 via RESPIRATORY_TRACT
  Filled 2018-08-19: qty 10.6

## 2018-08-19 MED ORDER — DEXAMETHASONE SODIUM PHOSPHATE 10 MG/ML IJ SOLN
10.0000 mg | Freq: Once | INTRAMUSCULAR | Status: AC
  Administered 2018-08-19: 10 mg via INTRAMUSCULAR
  Filled 2018-08-19: qty 1

## 2018-08-19 MED ORDER — IPRATROPIUM-ALBUTEROL 0.5-2.5 (3) MG/3ML IN SOLN
3.0000 mL | RESPIRATORY_TRACT | Status: DC
  Administered 2018-08-19: 3 mL via RESPIRATORY_TRACT
  Filled 2018-08-19: qty 3

## 2018-08-19 MED ORDER — ALBUTEROL SULFATE HFA 108 (90 BASE) MCG/ACT IN AERS
2.0000 | INHALATION_SPRAY | RESPIRATORY_TRACT | Status: DC | PRN
  Filled 2018-08-19: qty 6.7

## 2018-08-19 NOTE — ED Triage Notes (Signed)
Pt with cough, congestion an shortness of breath

## 2018-08-19 NOTE — ED Notes (Signed)
C/o cough, congestion, runny nose  Onset yesterday  Denies pain

## 2018-08-19 NOTE — ED Provider Notes (Signed)
[facility] Provider Note: Thomas Dell, MD, FACEP  CSN: [id number] MRN: [id number] ARRIVAL: 08/19/18 at 0235 ROOM: [facility]06/[facility]06   CHIEF COMPLAINT  Shortness of Breath   HISTORY OF PRESENT ILLNESS  08/19/18 2:42 AM Thomas [name] is a 58 y.o. male with a history of asthma.  He is here with cough and shortness of breath that began yesterday morning.  Symptoms are moderate to severe.  He has been using his albuterol inhaler without relief.  His albuterol inhaler may be out of date however.  He also complains of nasal congestion and sore throat but denies fever.    Past Medical History:  Diagnosis Date  . Asthma     Past Surgical History:  Procedure Laterality Date  . NO PAST SURGERIES      No family history on file.  Social History   Tobacco Use  . Smoking status: Never Smoker  Substance Use Topics  . Alcohol use: No  . Drug use: No    Prior to Admission medications   Medication Sig Start Date End Date Taking? Authorizing Provider  cetirizine (ZYRTEC) 10 MG tablet Take 10 mg by mouth daily.    [provider]  fluconazole (DIFLUCAN) 100 MG tablet Take 1 tablet (100 mg total) by mouth daily. 01/13/13   Kalman Shan, MD  levalbuterol [facility] HFA) 45 MCG/ACT inhaler Inhale 1-2 puffs into the lungs every 4 (four) hours as needed.    [provider]  mometasone-formoterol (DULERA) 200-5 MCG/ACT AERO Inhale 2 puffs into the lungs 2 (two) times daily.    [provider]  montelukast (SINGULAIR) 10 MG tablet Take 1 tablet (10 mg total) by mouth at bedtime. 08/30/12   Kalman Shan, MD  nystatin (MYCOSTATIN) 100000 UNIT/ML suspension Take 5 mLs (500,000 Units total) by mouth 4 (four) times daily. X 5 days. swish in the mouth and retain for as long as possible (several minutes) before swallowing 12/14/12   Kalman Shan, MD  tiotropium (SPIRIVA) 18 MCG inhalation capsule Place 1 capsule (18 mcg total) into inhaler and inhale daily.  01/13/13   Kalman Shan, MD    Allergies Patient has no known allergies.   REVIEW OF SYSTEMS  Negative except as noted here or in the History of Present Illness.   PHYSICAL EXAMINATION  Initial Vital Signs Blood pressure (!) 148/89, pulse 82, temperature 98.7 F (37.1 C), temperature source Oral, resp. rate 20, height 5\' 8"  (1.727 m), weight 74.8 kg, SpO2 98 %.  Examination General: Well-developed, well-nourished male in no acute distress; appearance consistent with age of record HENT: normocephalic; atraumatic Eyes: pupils equal, round and reactive to light; extraocular muscles intact; arcus senilis bilaterally Neck: supple Heart: regular rate and rhythm Lungs: clear to auscultation bilaterally; frequent dry cough Abdomen: soft; nondistended; nontender; bowel sounds present Extremities: No deformity; full range of motion Neurologic: Awake, alert and oriented; motor function intact in all extremities and symmetric; no facial droop Skin: Warm and dry Psychiatric: Normal mood and affect   RESULTS  Summary of this visit's results, reviewed by myself:   EKG Interpretation  Date/Time:    Ventricular Rate:    PR Interval:    QRS Duration:   QT Interval:    QTC Calculation:   R Axis:     Text Interpretation:        Laboratory Studies: No results found for this or any previous visit (from the past 24 hour(s)). Imaging Studies: Dg Chest 2 View  Result Date: 08/19/2018 CLINICAL  DATA:  Acute onset of cough, congestion and shortness of breath. EXAM: CHEST - 2 VIEW COMPARISON:  None. FINDINGS: The lungs are well-aerated and clear. There is no evidence of focal opacification, pleural effusion or pneumothorax. The heart is normal in size; the mediastinal contour is within normal limits. No acute osseous abnormalities are seen. IMPRESSION: No acute cardiopulmonary process seen. Electronically Signed   By: Roanna Raider M.D.   On: 08/19/2018 03:20    ED COURSE and MDM    Nursing notes and initial vitals signs, including pulse oximetry, reviewed.  Vitals:   08/19/18 0238 08/19/18 0240 08/19/18 0255  BP:  (!) 148/89   Pulse:  82   Resp:  20   Temp:  98.7 F (37.1 C)   TempSrc:  Oral   SpO2:  98% 98%  Weight: 74.8 kg    Height: 5\' 8"  (1.727 m)     3:42 AM Patient feels better after DuoNeb treatment.  We will provide him with a new albuterol inhaler and AeroChamber as he is current inhaler may be outdated.  Associated symptoms suggest an acute viral illness.  PROCEDURES    ED DIAGNOSES     ICD-10-CM   1. Acute bronchitis with bronchospasm J20.9        Tinzley Dalia, Jonny Ruiz, MD 08/19/18 [phone]

## 2018-09-28 ENCOUNTER — Emergency Department (HOSPITAL_BASED_OUTPATIENT_CLINIC_OR_DEPARTMENT_OTHER): Payer: 59

## 2018-09-28 ENCOUNTER — Encounter (HOSPITAL_BASED_OUTPATIENT_CLINIC_OR_DEPARTMENT_OTHER): Payer: Self-pay

## 2018-09-28 ENCOUNTER — Other Ambulatory Visit: Payer: Self-pay

## 2018-09-28 ENCOUNTER — Emergency Department (HOSPITAL_BASED_OUTPATIENT_CLINIC_OR_DEPARTMENT_OTHER)
Admission: EM | Admit: 2018-09-28 | Discharge: 2018-09-28 | Disposition: A | Discharge status: Home / Self Care | Payer: 59 | Attending: Emergency Medicine | Admitting: Emergency Medicine

## 2018-09-28 DIAGNOSIS — R0602 Shortness of breath: Secondary | ICD-10-CM | POA: Insufficient documentation

## 2018-09-28 DIAGNOSIS — R51 Headache: Secondary | ICD-10-CM | POA: Diagnosis present

## 2018-09-28 DIAGNOSIS — G4489 Other headache syndrome: Secondary | ICD-10-CM | POA: Diagnosis not present

## 2018-09-28 DIAGNOSIS — R059 Cough, unspecified: Secondary | ICD-10-CM

## 2018-09-28 DIAGNOSIS — Z79899 Other long term (current) drug therapy: Secondary | ICD-10-CM | POA: Diagnosis not present

## 2018-09-28 DIAGNOSIS — J45909 Unspecified asthma, uncomplicated: Secondary | ICD-10-CM | POA: Diagnosis not present

## 2018-09-28 DIAGNOSIS — R05 Cough: Secondary | ICD-10-CM

## 2018-09-28 MED ORDER — DEXAMETHASONE 4 MG PO TABS
8.0000 mg | ORAL_TABLET | Freq: Once | ORAL | Status: AC
  Administered 2018-09-28: 8 mg via ORAL
  Filled 2018-09-28: qty 2

## 2018-09-28 MED ORDER — ALBUTEROL SULFATE HFA 108 (90 BASE) MCG/ACT IN AERS
2.0000 | INHALATION_SPRAY | Freq: Once | RESPIRATORY_TRACT | Status: AC
  Administered 2018-09-28: 2 via RESPIRATORY_TRACT
  Filled 2018-09-28: qty 6.7

## 2018-09-28 MED ORDER — IPRATROPIUM-ALBUTEROL 0.5-2.5 (3) MG/3ML IN SOLN
3.0000 mL | Freq: Four times a day (QID) | RESPIRATORY_TRACT | Status: DC
  Filled 2018-09-28: qty 3

## 2018-09-28 MED ORDER — IPRATROPIUM-ALBUTEROL 0.5-2.5 (3) MG/3ML IN SOLN
3.0000 mL | Freq: Once | RESPIRATORY_TRACT | Status: AC
  Administered 2018-09-28: 3 mL via RESPIRATORY_TRACT

## 2018-09-28 MED ORDER — IBUPROFEN 400 MG PO TABS
400.0000 mg | ORAL_TABLET | Freq: Once | ORAL | Status: AC
  Administered 2018-09-28: 400 mg via ORAL
  Filled 2018-09-28: qty 1

## 2018-09-28 NOTE — ED Provider Notes (Signed)
[facility] EMERGENCY DEPARTMENT Provider Note   CSN: [id number] Arrival date & time: 09/28/18  0330     History   Chief Complaint Chief Complaint  Patient presents with  . Headache  . Shortness of Breath  Patient gives permission to perform history and physical in front of family/friend  HPI Thomas [name] is a 58 y.o. male.  The history is provided by the spouse and the patient.  Cough  Cough characteristics:  Non-productive Severity:  Moderate Timing:  Intermittent Progression:  Unchanged Chronicity:  New Smoker: no   Relieved by:  Nothing Worsened by:  Nothing Associated symptoms: headaches, shortness of breath and wheezing   Associated symptoms: no chest pain   Patient reports he went to bed feeling well.  He reports he woke up with a frontal headache, cough, feeling warm, and shortness of breath.  No chest pain.  He had a nonproductive cough.  No vomiting or diarrhea.  He did try using home nebulized therapy without improvement   Past Medical History:  Diagnosis Date  . Asthma     Patient Active Problem List   Diagnosis Date Noted  . Urticarial rash 12/15/2012  . Oral thrush 08/30/2012  . Asthma 07/28/2012    Past Surgical History:  Procedure Laterality Date  . NO PAST SURGERIES          Home Medications    Prior to Admission medications   Medication Sig Start Date End Date Taking? Authorizing Provider  Levalbuterol Tartrate (XOPENEX HFA IN) Inhale into the lungs 2 (two) times daily.   Yes [provider]  mometasone-formoterol (DULERA) 100-5 MCG/ACT AERO Inhale 2 puffs into the lungs 2 (two) times daily.   Yes [provider]    Family History No family history on file.  Social History Social History   Tobacco Use  . Smoking status: Never Smoker  . Smokeless tobacco: Never Used  Substance Use Topics  . Alcohol use: No  . Drug use: No     Allergies   Patient has no known allergies.   Review of  Systems Review of Systems  Constitutional: Positive for fatigue.  Respiratory: Positive for cough, shortness of breath and wheezing.   Cardiovascular: Negative for chest pain.  Gastrointestinal: Negative for vomiting.  Neurological: Positive for headaches.  All other systems reviewed and are negative.    Physical Exam Updated Vital Signs BP (!) 146/83   Pulse 79   Temp 98.3 F (36.8 C) (Oral)   Resp 19   Ht 1.727 m (5\' 8" )   Wt 74.8 kg   SpO2 98%   BMI 25.09 kg/m   Physical Exam CONSTITUTIONAL: Well developed/well nourished HEAD: Normocephalic/atraumatic EYES: EOMI/PERRL, no nystagmus, no ptosis ENMT: Mucous membranes moist NECK: supple no meningeal signs, no bruits SPINE/BACK:entire spine nontender CV: S1/S2 noted, no murmurs/rubs/gallops noted LUNGS: Scattered wheezing bilaterally, no acute distress ABDOMEN: soft, nontender, no rebound or guarding GU:no cva tenderness NEURO:Awake/alert, face symmetric, no arm or leg drift is noted Equal 5/5 strength with shoulder abduction, elbow flex/extension, wrist flex/extension in upper extremities Equal 5/5 strength with hip flexion,knee flex/extension, foot dorsi/plantar flexion Cranial nerves 3/4/5/6/02/23/09/11/12 tested and intact Gait normal without ataxia EXTREMITIES: pulses normal, full ROM SKIN: warm, color normal PSYCH: no abnormalities of mood noted, alert and oriented to situation   ED Treatments / Results  Labs (all labs ordered are listed, but only abnormal results are displayed) Labs Reviewed - No data to display  EKG EKG Interpretation  Date/Time:  Tuesday September 28 2018 04:54:20 EST Ventricular Rate:  77 PR Interval:    QRS Duration: 101 QT Interval:  367 QTC Calculation: 416 R Axis:   -17 Text Interpretation:  Sinus rhythm Borderline left axis deviation Anterior infarct, old Abnormal ekg No previous ECGs available Confirmed by Zadie Rhine ([id number]) on 09/28/2018 5:04:06 AM   Radiology Dg Chest  2 View  Result Date: 09/28/2018 CLINICAL DATA:  58 year old male with history of asthma. Cough and congestion and shortness of breath for the past 10 hours. EXAM: CHEST - 2 VIEW COMPARISON:  Chest x-ray 08/19/2018. FINDINGS: Lung volumes are normal. No consolidative airspace disease. No pleural effusions. No pneumothorax. No pulmonary nodule or mass noted. Pulmonary vasculature and the cardiomediastinal silhouette are within normal limits. IMPRESSION: No radiographic evidence of acute cardiopulmonary disease. Electronically Signed   By: Trudie Reed M.D.   On: 09/28/2018 05:20    Procedures Procedures   Medications Ordered in ED Medications  ibuprofen (ADVIL,MOTRIN) tablet 400 mg (400 mg Oral Given 09/28/18 0527)  ipratropium-albuterol (DUONEB) 0.5-2.5 (3) MG/3ML nebulizer solution 3 mL (3 mLs Nebulization Given 09/28/18 0528)  dexamethasone (DECADRON) tablet 8 mg (8 mg Oral Given 09/28/18 0616)  albuterol (PROVENTIL HFA;VENTOLIN HFA) 108 (90 Base) MCG/ACT inhaler 2 puff (2 puffs Inhalation Given 09/28/18 6160)     Initial Impression / Assessment and Plan / ED Course  I have reviewed the triage vital signs and the nursing notes.  Pertinent  imaging results that were available during my care of the patient were reviewed by me and considered in my medical decision making (see chart for details).     PT with history of asthma presented with cough, shortness of breath, feeling feverish and headache.  He reports he had been feeling well prior to bed. On further evaluation, suspicion that this could be early influenza though he is not febrile.  He does endorse feeling feverish, cough as well as fatigue. He also had some wheeze that improved with nebulizers, he was given albuterol inhaler as well as one-time dose of Decadron He had no chest pain.  Suspicion for PE is low. His physical exam and history are not consistent with ACS. Chest x-ray was reviewed and is negative Patient reports feeling  dramatic improvement with ibuprofen. Feels comfortable for discharge, and I encouraged him to rest and monitoring his symptoms.  We discussed strict return precautions Final Clinical Impressions(s) / ED Diagnoses   Final diagnoses:  Cough  Shortness of breath  Other headache syndrome    ED Discharge Orders    None       Zadie Rhine, MD 09/28/18 [phone]

## 2018-09-28 NOTE — ED Notes (Signed)
ED Provider at bedside. 

## 2020-10-06 IMAGING — DX DG CHEST 2V
2 series · 2 of 2 positions shown · non-contrast
Comparison: None.

CLINICAL DATA: Acute onset of cough, congestion and shortness of
breath.

EXAM:
CHEST - 2 VIEW

[chest pa]
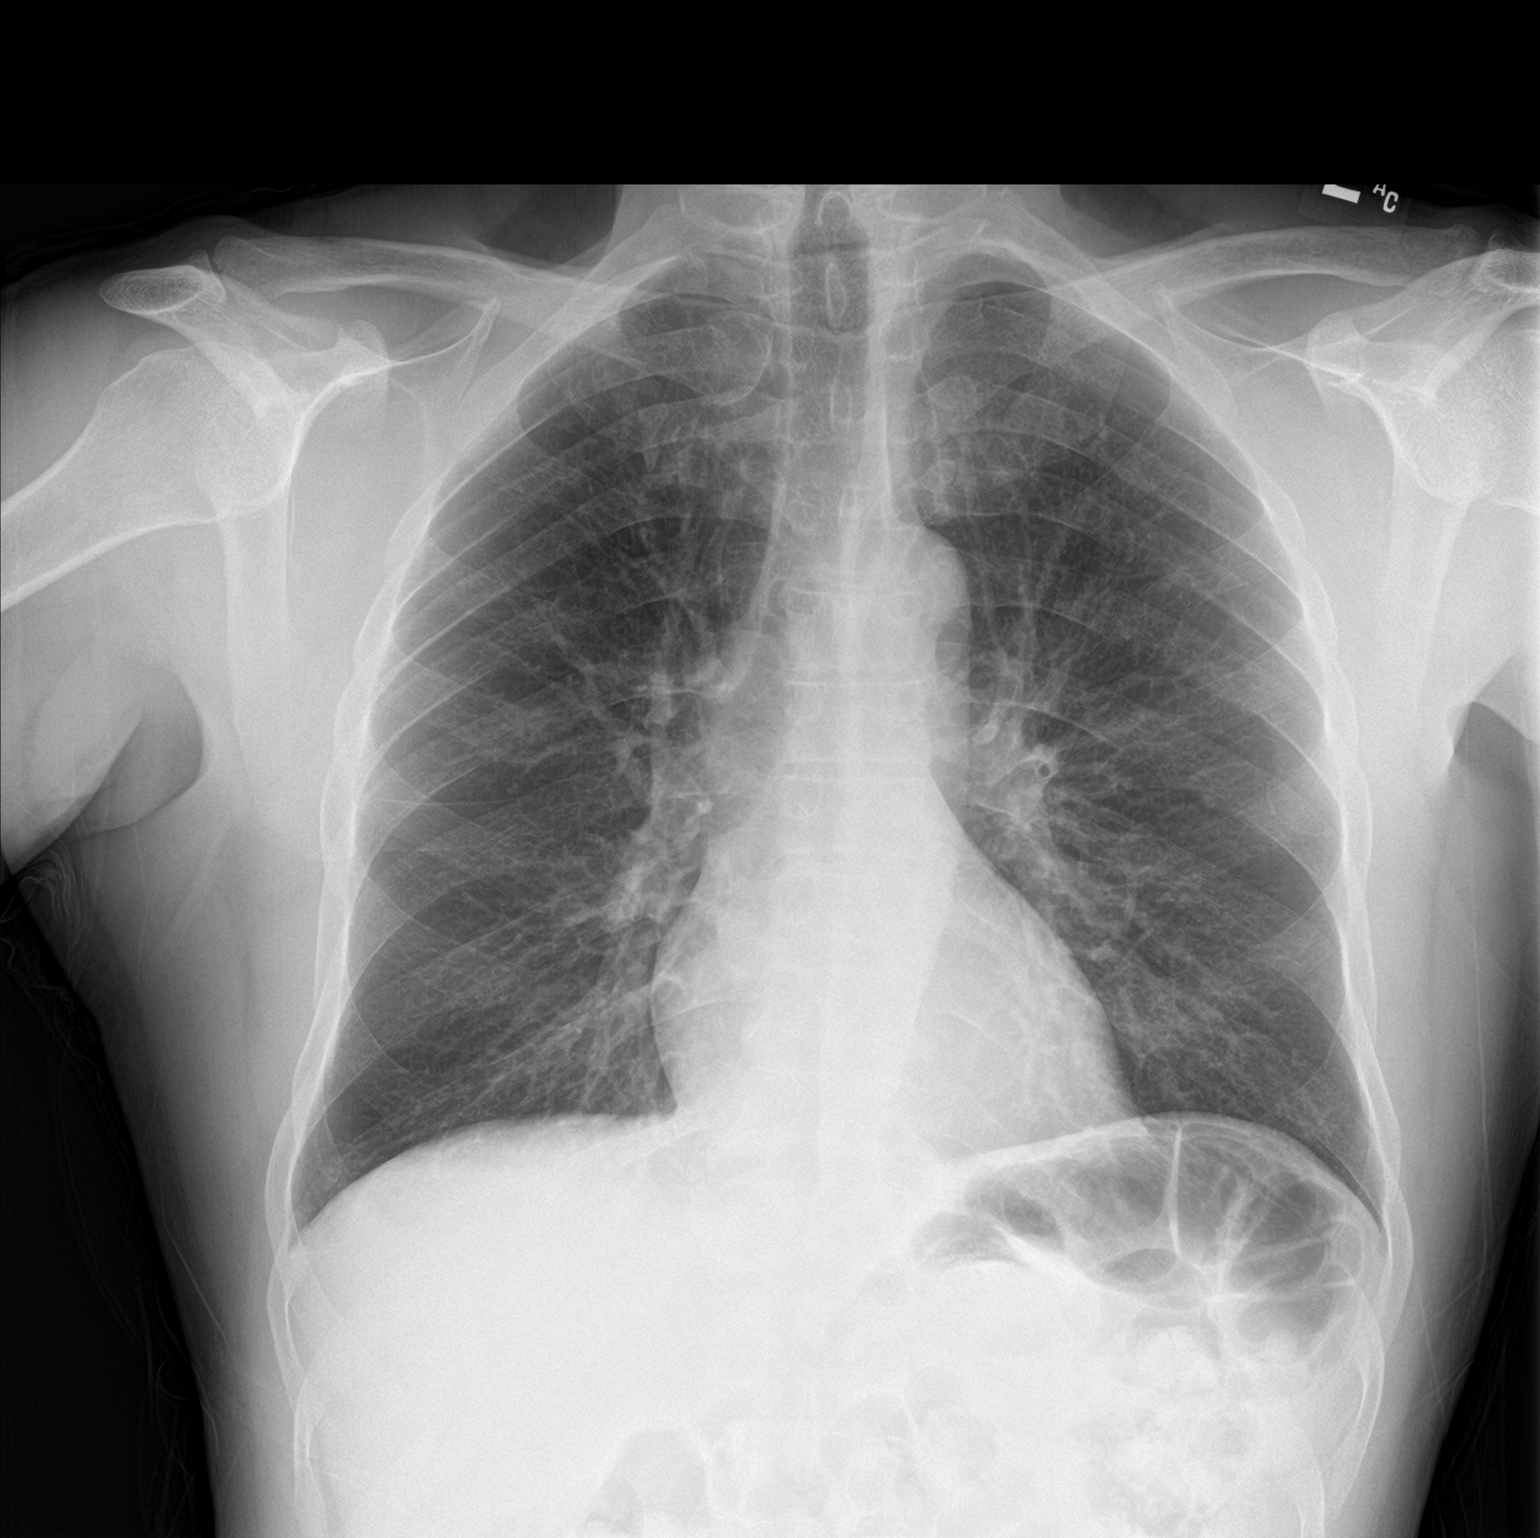

[chest lat]
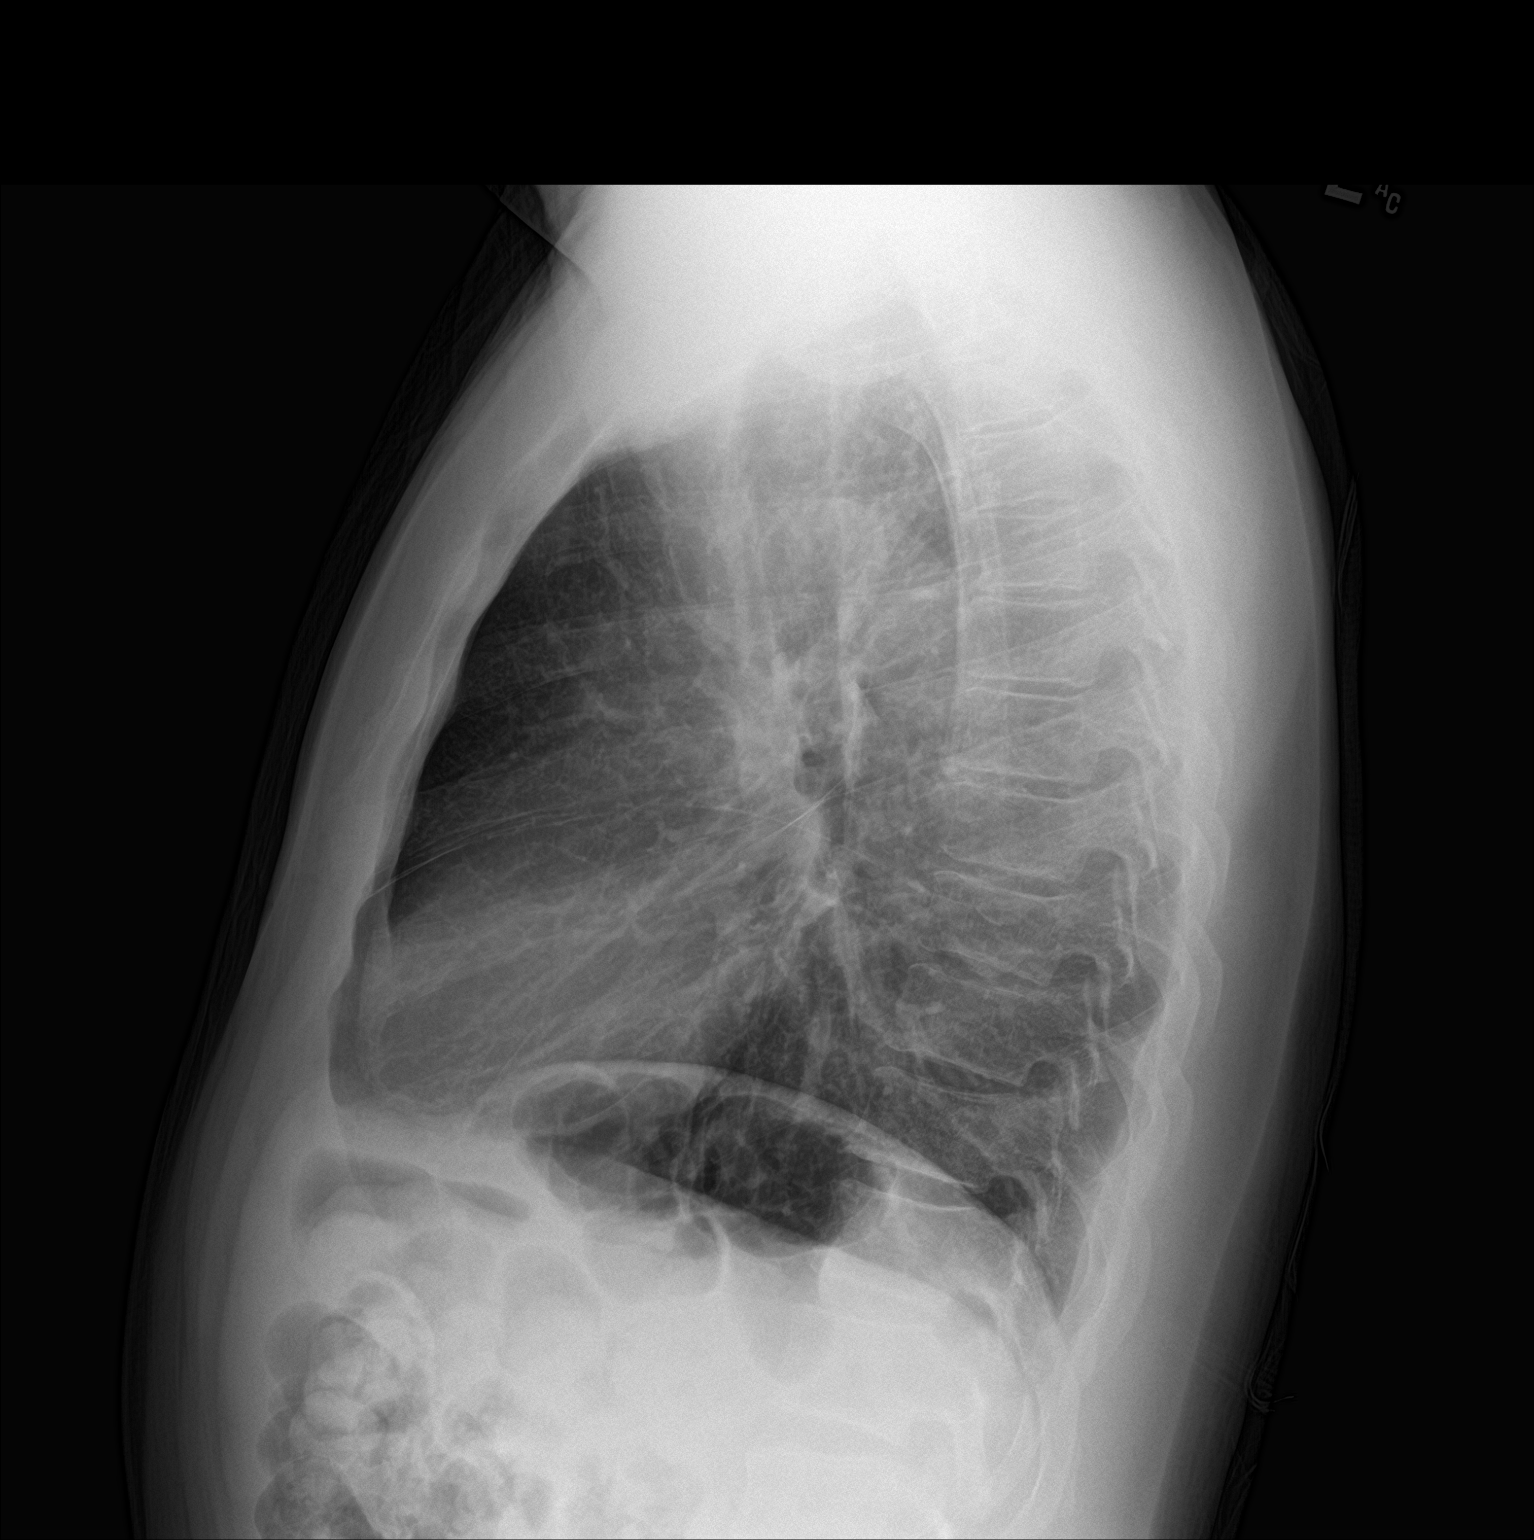

[2 of 2 positions shown; findings below may reference images not displayed]

FINDINGS: The lungs are well-aerated and clear. There is no evidence of focal
opacification, pleural effusion or pneumothorax.

The heart is normal in size; the mediastinal contour is within
normal limits. No acute osseous abnormalities are seen.
IMPRESSION: No acute cardiopulmonary process seen.

## 2021-04-02 ENCOUNTER — Emergency Department (HOSPITAL_BASED_OUTPATIENT_CLINIC_OR_DEPARTMENT_OTHER)
Admission: EM | Admit: 2021-04-02 | Discharge: 2021-04-03 | Disposition: A | Discharge status: Home / Self Care | Payer: 59 | Attending: Emergency Medicine | Admitting: Emergency Medicine

## 2021-04-02 ENCOUNTER — Other Ambulatory Visit: Payer: Self-pay

## 2021-04-02 ENCOUNTER — Encounter (HOSPITAL_BASED_OUTPATIENT_CLINIC_OR_DEPARTMENT_OTHER): Payer: Self-pay

## 2021-04-02 DIAGNOSIS — R0602 Shortness of breath: Secondary | ICD-10-CM | POA: Diagnosis present

## 2021-04-02 DIAGNOSIS — J4521 Mild intermittent asthma with (acute) exacerbation: Secondary | ICD-10-CM | POA: Diagnosis not present

## 2021-04-02 DIAGNOSIS — L209 Atopic dermatitis, unspecified: Secondary | ICD-10-CM | POA: Insufficient documentation

## 2021-04-02 MED ORDER — DIPHENHYDRAMINE HCL 50 MG/ML IJ SOLN
12.5000 mg | Freq: Once | INTRAMUSCULAR | Status: AC
  Administered 2021-04-02: 12.5 mg via INTRAVENOUS
  Filled 2021-04-02: qty 1

## 2021-04-02 MED ORDER — IPRATROPIUM-ALBUTEROL 0.5-2.5 (3) MG/3ML IN SOLN
3.0000 mL | Freq: Once | RESPIRATORY_TRACT | Status: AC
  Administered 2021-04-02: 3 mL via RESPIRATORY_TRACT
  Filled 2021-04-02: qty 3

## 2021-04-02 MED ORDER — DEXAMETHASONE SODIUM PHOSPHATE 10 MG/ML IJ SOLN
10.0000 mg | Freq: Once | INTRAMUSCULAR | Status: AC
  Administered 2021-04-02: 10 mg via INTRAVENOUS
  Filled 2021-04-02: qty 1

## 2021-04-02 NOTE — ED Triage Notes (Signed)
Pt c/o SOB started ~12pm-scattered rash x 2 days-denies fever/cough-NAD-steady gait-reports last used inhaler ~930pm

## 2021-04-03 MED ORDER — ALBUTEROL SULFATE HFA 108 (90 BASE) MCG/ACT IN AERS
2.0000 | INHALATION_SPRAY | RESPIRATORY_TRACT | Status: DC | PRN
  Administered 2021-04-03: 2 via RESPIRATORY_TRACT
  Filled 2021-04-03: qty 6.7

## 2021-04-03 MED ORDER — IPRATROPIUM-ALBUTEROL 0.5-2.5 (3) MG/3ML IN SOLN
3.0000 mL | Freq: Once | RESPIRATORY_TRACT | Status: AC
  Administered 2021-04-03: 3 mL via RESPIRATORY_TRACT
  Filled 2021-04-03: qty 3

## 2021-04-03 MED ORDER — TRIAMCINOLONE ACETONIDE 0.1 % EX CREA: 1.0000 "application " | TOPICAL_CREAM | Freq: Two times a day (BID) | CUTANEOUS | 0 refills | Status: DC

## 2021-04-03 NOTE — ED Notes (Signed)
Pt ambulatory at d/c with independent steady gait 

## 2021-04-03 NOTE — Discharge Instructions (Addendum)
You were seen today for shortness of breath and rash.  Your symptoms are consistent with an asthma exacerbation.  You also have evidence of dermatitis.  This is likely atopic dermatitis; however, could be related to a contact allergy as well.  Avoid dyes or scented lotions or soaps.  Take Benadryl as needed.  Use triamcinolone cream but do not apply to your face.  Use your inhaler every 2-4 hours as needed for shortness of breath.

## 2021-04-03 NOTE — ED Provider Notes (Signed)
[facility] EMERGENCY DEPARTMENT Provider Note   CSN: [id number] Arrival date & time: 04/02/21  2151     History Chief Complaint  Patient presents with   Shortness of Breath    Thomas [name] is a 60 y.o. male.  HPI     This a 60 year old male with a history of asthma who presents with rash and shortness of breath.  Patient reports 1 day history of worsening shortness of breath.  He has been using his inhaler with some relief.  Denies fever or cough.  He states over the last several days he has developed an itchy rash involving his legs, arms, and trunk.  Denies any lotions or soaps.  Wife does report that she changed detergents about 2 months ago.  He has been scratching the rash.  No known sick contacts or COVID exposures.  He has never been admitted for his asthma.  Past Medical History:  Diagnosis Date   Asthma     Patient Active Problem List   Diagnosis Date Noted   Urticarial rash 12/15/2012   Oral thrush 08/30/2012   Asthma 07/28/2012    Past Surgical History:  Procedure Laterality Date   NO PAST SURGERIES         No family history on file.  Social History   Tobacco Use   Smoking status: Never   Smokeless tobacco: Never  Vaping Use   Vaping Use: Never used  Substance Use Topics   Alcohol use: No   Drug use: No    Home Medications Prior to Admission medications   Medication Sig Start Date End Date Taking? Authorizing Provider  triamcinolone cream (KENALOG) 0.1 % Apply 1 application topically 2 (two) times daily. 04/03/21  Yes Marsela Kuan, Mayer Masker, MD  Levalbuterol Tartrate (XOPENEX HFA IN) Inhale into the lungs 2 (two) times daily.    [provider]  mometasone-formoterol (DULERA) 100-5 MCG/ACT AERO Inhale 2 puffs into the lungs 2 (two) times daily.    [provider]    Allergies    Patient has no known allergies.  Review of Systems   Review of Systems  Constitutional:  Negative for fever.  Respiratory:  Positive  for shortness of breath and wheezing. Negative for cough.   Cardiovascular:  Negative for chest pain.  Gastrointestinal:  Negative for abdominal pain, nausea and vomiting.  Skin:  Positive for rash.  All other systems reviewed and are negative.  Physical Exam Updated Vital Signs BP 112/65   Pulse 79   Temp 98 F (36.7 C) (Oral)   Resp 18   Ht 1.727 m (5\' 8" )   Wt 70.3 kg   SpO2 98%   BMI 23.57 kg/m   Physical Exam Vitals and nursing note reviewed.  Constitutional:      Appearance: He is well-developed. He is not ill-appearing.  HENT:     Head: Normocephalic and atraumatic.  Eyes:     Pupils: Pupils are equal, round, and reactive to light.  Cardiovascular:     Rate and Rhythm: Normal rate and regular rhythm.     Heart sounds: Normal heart sounds. No murmur heard. Pulmonary:     Effort: Pulmonary effort is normal. No respiratory distress.     Breath sounds: Normal breath sounds. No wheezing.     Comments: Normal effort, speaking in full sentences, poor aeration with end expiratory wheezing, tight Abdominal:     General: Bowel sounds are normal.     Palpations: Abdomen is soft.  Tenderness: There is no abdominal tenderness. There is no rebound.  Musculoskeletal:     Cervical back: Neck supple.     Right lower leg: No edema.     Left lower leg: No edema.  Lymphadenopathy:     Cervical: No cervical adenopathy.  Skin:    General: Skin is warm and dry.     Comments: Darkened raised patches over the bilateral arms, trunk, chest, bilateral lower extremities with overlying excoriations, no desquamation or vesicles noted spares the palms and soles  Neurological:     Mental Status: He is alert and oriented to person, place, and time.  Psychiatric:        Mood and Affect: Mood normal.    ED Results / Procedures / Treatments   Labs (all labs ordered are listed, but only abnormal results are displayed) Labs Reviewed - No data to display  EKG None  Radiology No  results found.  Procedures Procedures   Medications Ordered in ED Medications  dexamethasone (DECADRON) injection 10 mg (10 mg Intravenous Given 04/02/21 2321)  diphenhydrAMINE (BENADRYL) injection 12.5 mg (12.5 mg Intravenous Given 04/02/21 2321)  ipratropium-albuterol (DUONEB) 0.5-2.5 (3) MG/3ML nebulizer solution 3 mL (3 mLs Nebulization Given 04/02/21 2323)  ipratropium-albuterol (DUONEB) 0.5-2.5 (3) MG/3ML nebulizer solution 3 mL (3 mLs Nebulization Given 04/03/21 0110)    ED Course  I have reviewed the triage vital signs and the nursing notes.  Pertinent labs & imaging results that were available during my care of the patient were reviewed by me and considered in my medical decision making (see chart for details).  Clinical Course as of 04/03/21 0134  Wed Apr 03, 2021  0045 Patient reports overall improvement of symptoms including shortness of breath and itching.  On exam, he still has persistent wheeze but better aeration.  Repeat DuoNeb ordered. [CH]    Clinical Course User Index [CH] Nyles Mitton, Mayer Masker, MD   MDM Rules/Calculators/A&P                           Patient presents with shortness of breath and rash.  He is overall nontoxic and vital signs are reassuring.  He is not hypoxic.  He is overall not in any respiratory distress but does appear tight on exam with some expiratory wheezing.  Likely asthma exacerbation as he does not have a fever and denies infectious symptoms.  He was given a dose of Decadron and a DuoNeb.  Regarding his rash, it most favors an atopic or eczematous rash; however, contact dermatitis is also consideration.  Less suspicious for infectious etiology.  It spares palms and soles.  Patient was given a dose of Benadryl for itching.  Suspect that the Decadron injection will also help with any atopic or contact dermatitis.  On multiple rechecks, patient progressively improving with breathing treatments.  Recommend topical triamcinolone cream and follow-up with  primary physician.  After history, exam, and medical workup I feel the patient has been appropriately medically screened and is safe for discharge home. Pertinent diagnoses were discussed with the patient. Patient was given return precautions.  Final Clinical Impression(s) / ED Diagnoses Final diagnoses:  Mild intermittent asthma with exacerbation  Atopic dermatitis, unspecified type    Rx / DC Orders ED Discharge Orders          Ordered    triamcinolone cream (KENALOG) 0.1 %  2 times daily        04/03/21 0133  Shon Baton, MD 04/03/21 [phone]

## 2021-04-22 ENCOUNTER — Emergency Department (HOSPITAL_BASED_OUTPATIENT_CLINIC_OR_DEPARTMENT_OTHER): Payer: 59

## 2021-04-22 ENCOUNTER — Encounter (HOSPITAL_BASED_OUTPATIENT_CLINIC_OR_DEPARTMENT_OTHER): Payer: Self-pay

## 2021-04-22 ENCOUNTER — Other Ambulatory Visit: Payer: Self-pay

## 2021-04-22 ENCOUNTER — Emergency Department (HOSPITAL_BASED_OUTPATIENT_CLINIC_OR_DEPARTMENT_OTHER)
Admission: EM | Admit: 2021-04-22 | Discharge: 2021-04-23 | Disposition: A | Discharge status: Home / Self Care | Payer: 59 | Attending: Emergency Medicine | Admitting: Emergency Medicine

## 2021-04-22 DIAGNOSIS — D649 Anemia, unspecified: Secondary | ICD-10-CM | POA: Insufficient documentation

## 2021-04-22 DIAGNOSIS — R0602 Shortness of breath: Secondary | ICD-10-CM | POA: Diagnosis present

## 2021-04-22 DIAGNOSIS — J4521 Mild intermittent asthma with (acute) exacerbation: Secondary | ICD-10-CM | POA: Diagnosis not present

## 2021-04-22 DIAGNOSIS — M7989 Other specified soft tissue disorders: Secondary | ICD-10-CM | POA: Diagnosis not present

## 2021-04-22 DIAGNOSIS — Z79899 Other long term (current) drug therapy: Secondary | ICD-10-CM | POA: Diagnosis not present

## 2021-04-22 DIAGNOSIS — R21 Rash and other nonspecific skin eruption: Secondary | ICD-10-CM | POA: Insufficient documentation

## 2021-04-22 NOTE — ED Triage Notes (Addendum)
HX of asthma complains of shortness of breath that started today along with feet swelling. 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

## 2021-04-23 ENCOUNTER — Emergency Department (HOSPITAL_BASED_OUTPATIENT_CLINIC_OR_DEPARTMENT_OTHER): Payer: 59

## 2021-04-23 ENCOUNTER — Encounter (HOSPITAL_BASED_OUTPATIENT_CLINIC_OR_DEPARTMENT_OTHER): Payer: Self-pay | Admitting: Radiology

## 2021-04-23 ENCOUNTER — Ambulatory Visit (HOSPITAL_BASED_OUTPATIENT_CLINIC_OR_DEPARTMENT_OTHER)
Admission: RE | Admit: 2021-04-23 | Discharge: 2021-04-23 | Disposition: A | Discharge status: Home / Self Care | Payer: 59 | Source: Ambulatory Visit | Attending: Emergency Medicine | Admitting: Emergency Medicine

## 2021-04-23 ENCOUNTER — Other Ambulatory Visit (HOSPITAL_BASED_OUTPATIENT_CLINIC_OR_DEPARTMENT_OTHER): Payer: Self-pay | Admitting: Emergency Medicine

## 2021-04-23 DIAGNOSIS — M7989 Other specified soft tissue disorders: Secondary | ICD-10-CM

## 2021-04-23 LAB — COMPREHENSIVE METABOLIC PANEL
ALT: 13 U/L (ref 0–44)
AST: 21 U/L (ref 15–41)
Albumin: 3.2 g/dL — ABNORMAL LOW (ref 3.5–5.0)
Alkaline Phosphatase: 41 U/L (ref 38–126)
Anion gap: 5 (ref 5–15)
BUN: 9 mg/dL (ref 6–20)
CO2: 25 mmol/L (ref 22–32)
Calcium: 8.5 mg/dL — ABNORMAL LOW (ref 8.9–10.3)
Chloride: 107 mmol/L (ref 98–111)
Creatinine, Ser: 1.11 mg/dL (ref 0.61–1.24)
GFR, Estimated: >60 mL/min (ref >60)
Glucose, Bld: 108 mg/dL — ABNORMAL HIGH (ref 70–99)
Potassium: 3.3 mmol/L — ABNORMAL LOW (ref 3.5–5.1)
Sodium: 137 mmol/L (ref 135–145)
Total Bilirubin: 0.7 mg/dL (ref 0.3–1.2)
Total Protein: 7 g/dL (ref 6.5–8.1)

## 2021-04-23 LAB — CBC WITH DIFFERENTIAL/PLATELET
Abs Immature Granulocytes: 0.01 10*3/uL (ref 0.00–0.07)
Basophils Absolute: 0 10*3/uL (ref 0.0–0.1)
Basophils Relative: 0 %
Eosinophils Absolute: 2 10*3/uL — ABNORMAL HIGH (ref 0.0–0.5)
Eosinophils Relative: 34 %
HCT: 31.3 % — ABNORMAL LOW (ref 39.0–52.0)
Hemoglobin: 10.4 g/dL — ABNORMAL LOW (ref 13.0–17.0)
Immature Granulocytes: 0 %
Lymphocytes Relative: 19 %
Lymphs Abs: 1.1 10*3/uL (ref 0.7–4.0)
MCH: 33.5 pg (ref 26.0–34.0)
MCHC: 33.2 g/dL (ref 30.0–36.0)
MCV: 101 fL — ABNORMAL HIGH (ref 80.0–100.0)
Monocytes Absolute: 0.4 10*3/uL (ref 0.1–1.0)
Monocytes Relative: 7 %
Neutro Abs: 2.4 10*3/uL (ref 1.7–7.7)
Neutrophils Relative %: 40 %
Platelets: 223 10*3/uL (ref 150–400)
RBC: 3.1 MIL/uL — ABNORMAL LOW (ref 4.22–5.81)
RDW: 12.5 % (ref 11.5–15.5)
WBC: 6 10*3/uL (ref 4.0–10.5)
nRBC: 0 % (ref 0.0–0.2)

## 2021-04-23 LAB — D-DIMER, QUANTITATIVE: D-Dimer, Quant: 7.56 ug/mL-FEU — ABNORMAL HIGH (ref 0.00–0.50)

## 2021-04-23 LAB — BRAIN NATRIURETIC PEPTIDE: B Natriuretic Peptide: 82.5 pg/mL (ref 0.0–100.0)

## 2021-04-23 LAB — TROPONIN I (HIGH SENSITIVITY)
Troponin I (High Sensitivity): 3 ng/L (ref <18)
Troponin I (High Sensitivity): 3 ng/L (ref <18)

## 2021-04-23 MED ORDER — ENOXAPARIN SODIUM 80 MG/0.8ML IJ SOSY
1.0000 mg/kg | PREFILLED_SYRINGE | Freq: Once | INTRAMUSCULAR | Status: AC
  Administered 2021-04-23: 75 mg via SUBCUTANEOUS
  Filled 2021-04-23: qty 0.8

## 2021-04-23 MED ORDER — ALBUTEROL SULFATE HFA 108 (90 BASE) MCG/ACT IN AERS
2.0000 | INHALATION_SPRAY | Freq: Once | RESPIRATORY_TRACT | Status: AC
  Administered 2021-04-23: 2 via RESPIRATORY_TRACT
  Filled 2021-04-23: qty 6.7

## 2021-04-23 MED ORDER — PREDNISONE 10 MG PO TABS: 40.0000 mg | ORAL_TABLET | Freq: Every day | ORAL | 0 refills | Status: DC

## 2021-04-23 MED ORDER — PREDNISONE 20 MG PO TABS
40.0000 mg | ORAL_TABLET | Freq: Once | ORAL | Status: AC
  Administered 2021-04-23: 40 mg via ORAL
  Filled 2021-04-23: qty 2

## 2021-04-23 MED ORDER — IOHEXOL 350 MG/ML SOLN
100.0000 mL | Freq: Once | INTRAVENOUS | Status: AC | PRN
  Administered 2021-04-23: 100 mL via INTRAVENOUS

## 2021-04-23 MED ORDER — ALBUTEROL SULFATE HFA 108 (90 BASE) MCG/ACT IN AERS: 1.0000 | INHALATION_SPRAY | Freq: Four times a day (QID) | RESPIRATORY_TRACT | 0 refills | Status: DC | PRN

## 2021-04-23 MED ORDER — ALBUTEROL SULFATE (2.5 MG/3ML) 0.083% IN NEBU
2.5000 mg | INHALATION_SOLUTION | Freq: Once | RESPIRATORY_TRACT | Status: AC
  Administered 2021-04-23: 2.5 mg via RESPIRATORY_TRACT
  Filled 2021-04-23: qty 3

## 2021-04-23 NOTE — ED Provider Notes (Signed)
[facility] EMERGENCY DEPARTMENT Provider Note   CSN: [id number] Arrival date & time: 04/22/21  2343     History Chief Complaint  Patient presents with   Shortness of Breath    Thomas [name] is a 60 y.o. male.  The history is provided by the patient and medical records.  Shortness of Breath Thomas [name] is a 60 y.o. male who presents to the Emergency Department complaining of sob and swelling.  Difficulty breathing started this evening.  Today he noticed swelling to bilateral lower extremities.  Has rash to to all four extremities that started that started about a week ago.  Rash is itchy. He reports one month of generalized itching. 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  No fever, chest pain, cough, abdominal pain.  Has night sweats - states this is normal for him.  Has weight loss, 15-20 lbs over the last six months. No hematochezia or melena. Has a history of asthma and uses an inhaler but does not currently have one.  No tobacco, alcohol, drugs.    No hx/o blood clots.    No new sexual partners.       Past Medical History:  Diagnosis Date   Asthma     Patient Active Problem List   Diagnosis Date Noted   Urticarial rash 12/15/2012   Oral thrush 08/30/2012   Asthma 07/28/2012    Past Surgical History:  Procedure Laterality Date   NO PAST SURGERIES         No family history on file.  Social History   Tobacco Use   Smoking status: Never   Smokeless tobacco: Never  Vaping Use   Vaping Use: Never used  Substance Use Topics   Alcohol use: No   Drug use: No    Home Medications Prior to Admission medications   Medication Sig Start Date End Date Taking? Authorizing Provider  albuterol (VENTOLIN HFA) 108 (90 Base) MCG/ACT inhaler Inhale 1-2 puffs into the lungs every 6 (six) hours as needed for wheezing or shortness of breath. 04/23/21  Yes Tilden Fossa, MD  predniSONE (DELTASONE) 10 MG tablet Take 4 tablets (40 mg total) by mouth daily. 04/23/21  Yes Tilden Fossa, MD  Levalbuterol Tartrate [facility] HFA IN) Inhale into the lungs 2 (two) times daily.    [provider]  mometasone-formoterol (DULERA) 100-5 MCG/ACT AERO Inhale 2 puffs into the lungs 2 (two) times daily.    [provider]  triamcinolone cream (KENALOG) 0.1 % Apply 1 application topically 2 (two) times daily. 04/03/21   Horton, Mayer Masker, MD    Allergies    Patient has no known allergies.  Review of Systems   Review of Systems  Respiratory:  Positive for shortness of breath.   All other systems reviewed and are negative.  Physical Exam Updated Vital Signs BP (!) 143/81 (BP Location: Right Arm)   Pulse 75   Temp 98 F (36.7 C) (Oral)   Resp 17   Ht 5\' 8"  (1.727 m)   Wt 74.8 kg   SpO2 100%   BMI 25.09 kg/m   Physical Exam Vitals and nursing note reviewed.  Constitutional:      Appearance: He is well-developed.  HENT:     Head: Normocephalic and atraumatic.  Cardiovascular:     Rate and Rhythm: Normal rate and regular rhythm.     Heart sounds: No  murmur heard. Pulmonary:     Effort: Pulmonary effort is normal. No respiratory distress.     Comments: Occasional end expiratory wheezes Abdominal:     Palpations: Abdomen is soft.     Tenderness: There is no abdominal tenderness. There is no guarding or rebound.  Musculoskeletal:        General: Swelling present. No tenderness.     Comments: Edema to BLE, right greater than left.    Skin:    General: Skin is warm and dry.     Findings: Rash present.     Comments: There is a diffuse, coalescent well demarcated maculopapular rash involving trunk, BUE, BLE.  Spares neck and face.  No intraoral lesions.  Rash is hyperpigmented and nonblanching.  Involves palms and soles.    Neurological:     Mental Status: He is alert and oriented to person, place, and time.  Psychiatric:         Behavior: Behavior normal.    ED Results / Procedures / Treatments   Labs (all labs ordered are listed, but only abnormal results are displayed) Labs Reviewed  CBC WITH DIFFERENTIAL/PLATELET - Abnormal; Notable for the following components:      Result Value   RBC 3.10 (*)    Hemoglobin 10.4 (*)    HCT 31.3 (*)    MCV 101.0 (*)    Eosinophils Absolute 2.0 (*)    All other components within normal limits  COMPREHENSIVE METABOLIC PANEL - Abnormal; Notable for the following components:   Potassium 3.3 (*)    Glucose, Bld 108 (*)    Calcium 8.5 (*)    Albumin 3.2 (*)    All other components within normal limits  D-DIMER, QUANTITATIVE - Abnormal; Notable for the following components:   D-Dimer, Quant 7.56 (*)    All other components within normal limits  BRAIN NATRIURETIC PEPTIDE  TROPONIN I (HIGH SENSITIVITY)  TROPONIN I (HIGH SENSITIVITY)    EKG EKG Interpretation  Date/Time:  Monday April 22 2021 23:53:48 EDT Ventricular Rate:  77 PR Interval:  154 QRS Duration: 99 QT Interval:  400 QTC Calculation: 453 R Axis:   5 Text Interpretation: Sinus rhythm Anterior infarct, old Confirmed by Tilden Fossa [phone]) on 04/23/2021 12:02:35 AM  Radiology DG Chest 2 View  Result Date: 04/23/2021 CLINICAL DATA:  Shortness of breath EXAM: CHEST - 2 VIEW COMPARISON:  09/28/2018 FINDINGS: Heart and mediastinal contours are within normal limits. No focal opacities or effusions. No acute bony abnormality. IMPRESSION: No active cardiopulmonary disease. Electronically Signed   By: Charlett Nose M.D.   On: 04/23/2021 00:14   CT Angio Chest PE W/Cm &/Or Wo Cm  Result Date: 04/23/2021 CLINICAL DATA:  PE suspected, low/intermediate prob, positive D-dimer. Dyspnea, lower extremity swelling, history of asthma. EXAM: CT ANGIOGRAPHY CHEST WITH CONTRAST TECHNIQUE: Multidetector CT imaging of the chest was performed using the standard protocol during bolus administration of intravenous contrast.  Multiplanar CT image reconstructions and MIPs were obtained to evaluate the vascular anatomy. CONTRAST:  OMNIPAQUE IOHEXOL 350 MG/ML SOLN COMPARISON:  None. FINDINGS: Cardiovascular: Satisfactory opacification of the pulmonary arteries to the segmental level. No evidence of pulmonary embolism. Normal heart size. No pericardial effusion. Mediastinum/Nodes: There is shotty bilateral axillary adenopathy without pathologic enlargement. No frankly pathologic thoracic adenopathy. Visualized thyroid is unremarkable. Esophagus is unremarkable. Lungs/Pleura: Lungs are clear. No pleural effusion or pneumothorax. There is mild central bronchial wall thickening, in keeping with mild airway inflammation. No central obstructing mass. Upper Abdomen: No  acute abnormality. Musculoskeletal: No chest wall abnormality. No acute or significant osseous findings. Review of the MIP images confirms the above findings. IMPRESSION: No pulmonary embolism. Bronchial wall thickening in keeping with airway inflammation. No superimposed focal pulmonary infiltrate or central airway obstruction. Electronically Signed   By: Helyn Numbers M.D.   On: 04/23/2021 01:40    Procedures Procedures   Medications Ordered in ED Medications  predniSONE (DELTASONE) tablet 40 mg (40 mg Oral Given 04/23/21 0034)  albuterol (PROVENTIL) (2.5 MG/3ML) 0.083% nebulizer solution 2.5 mg (2.5 mg Nebulization Given 04/23/21 0037)  iohexol (OMNIPAQUE) 350 MG/ML injection 100 mL (100 mLs Intravenous Contrast Given 04/23/21 0122)  enoxaparin (LOVENOX) injection 75 mg (75 mg Subcutaneous Given 04/23/21 0249)  albuterol (VENTOLIN HFA) 108 (90 Base) MCG/ACT inhaler 2 puff (2 puffs Inhalation Given 04/23/21 0240)    ED Course  I have reviewed the triage vital signs and the nursing notes.  Pertinent labs & imaging results that were available during my care of the patient were reviewed by me and considered in my medical decision making (see chart for details).     MDM Rules/Calculators/A&P                          patient here for evaluation of rash, difficulty breathing in lower extremity edema. He is non-toxic appearing on evaluation. He does have occasional wheezing on examination without respiratory distress. EKG is without acute ischemic changes in troponin's are negative times two. No evidence of acute CHF, BNP is within normal limits. He does have asymmetric lower extremity edema on examination and a D dimer was obtained. D dimer was elevated and a CTA was obtained. CTA is negative for PE, pneumonia. Patient is feeling improved on reassessment. Plan to start on prednisone, continue albuterol for asthma exacerbation. Plan to have the patient return for vascular ultrasound to rule out DVT. He was treated empirically with one-time dose of Lovenox. Discussed with patient lab findings with anemia on CBC. Discussed importance of PCP follow-up for further evaluation. Also discussed importance of patient following up with allergist or dermatology regarding his progressive rash. Return precautions discussed. In terms of rash - this is not consistent with SJS, serious bacterial infection.  Final Clinical Impression(s) / ED Diagnoses Final diagnoses:  Mild intermittent asthma with exacerbation  Rash  Anemia, unspecified type    Rx / DC Orders ED Discharge Orders          Ordered    predniSONE (DELTASONE) 10 MG tablet  Daily        04/23/21 0229    albuterol (VENTOLIN HFA) 108 (90 Base) MCG/ACT inhaler  Every 6 hours PRN        04/23/21 0229             Tilden Fossa, MD 04/23/21 [phone]

## 2021-04-23 NOTE — Discharge Instructions (Addendum)
Your hemoglobin (red blood cells) was low today.  It is very important to follow up with your family doctor for further evaluation and recheck.

## 2021-04-23 NOTE — ED Notes (Signed)
Pt A&OX4 ambulatory at d/c with independent steady gait 

## 2021-04-23 NOTE — ED Notes (Signed)
Pt informed to come back to ER at 10am for [place] of legs. Pt verbalized understanding.

## 2021-12-30 ENCOUNTER — Emergency Department (HOSPITAL_BASED_OUTPATIENT_CLINIC_OR_DEPARTMENT_OTHER): Payer: Managed Care, Other (non HMO)

## 2021-12-30 ENCOUNTER — Other Ambulatory Visit: Payer: Self-pay

## 2021-12-30 ENCOUNTER — Emergency Department (HOSPITAL_BASED_OUTPATIENT_CLINIC_OR_DEPARTMENT_OTHER)
Admission: EM | Admit: 2021-12-30 | Discharge: 2021-12-31 | Disposition: A | Discharge status: Home / Self Care | Payer: Managed Care, Other (non HMO) | Attending: Emergency Medicine | Admitting: Emergency Medicine

## 2021-12-30 ENCOUNTER — Encounter (HOSPITAL_BASED_OUTPATIENT_CLINIC_OR_DEPARTMENT_OTHER): Payer: Self-pay | Admitting: Emergency Medicine

## 2021-12-30 DIAGNOSIS — J45909 Unspecified asthma, uncomplicated: Secondary | ICD-10-CM | POA: Insufficient documentation

## 2021-12-30 DIAGNOSIS — R0602 Shortness of breath: Secondary | ICD-10-CM | POA: Insufficient documentation

## 2021-12-30 DIAGNOSIS — Z7952 Long term (current) use of systemic steroids: Secondary | ICD-10-CM | POA: Diagnosis not present

## 2021-12-30 DIAGNOSIS — Z7951 Long term (current) use of inhaled steroids: Secondary | ICD-10-CM | POA: Insufficient documentation

## 2021-12-30 DIAGNOSIS — R21 Rash and other nonspecific skin eruption: Secondary | ICD-10-CM | POA: Insufficient documentation

## 2021-12-30 MED ORDER — ALBUTEROL SULFATE HFA 108 (90 BASE) MCG/ACT IN AERS
2.0000 | INHALATION_SPRAY | RESPIRATORY_TRACT | Status: DC | PRN
  Administered 2021-12-31: 2 via RESPIRATORY_TRACT
  Filled 2021-12-30: qty 6.7

## 2021-12-30 MED ORDER — PREDNISONE 50 MG PO TABS
60.0000 mg | ORAL_TABLET | Freq: Once | ORAL | Status: AC
  Administered 2021-12-30: 60 mg via ORAL
  Filled 2021-12-30: qty 1

## 2021-12-30 MED ORDER — IPRATROPIUM-ALBUTEROL 0.5-2.5 (3) MG/3ML IN SOLN
3.0000 mL | RESPIRATORY_TRACT | Status: DC
  Administered 2021-12-30: 3 mL via RESPIRATORY_TRACT
  Filled 2021-12-30: qty 3

## 2021-12-30 MED ORDER — DIPHENHYDRAMINE HCL 25 MG PO CAPS
50.0000 mg | ORAL_CAPSULE | Freq: Once | ORAL | Status: AC
  Administered 2021-12-30: 50 mg via ORAL
  Filled 2021-12-30: qty 2

## 2021-12-30 NOTE — ED Triage Notes (Signed)
Rash on neck, arms and back and SOB x 1 week. SOB worse with exertion and pt states he has to stay calm. 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 Pt has been applying ointment to neck and arms with no relief.  ?

## 2021-12-30 NOTE — ED Provider Notes (Signed)
? ?[facility] ?Provider Note: Lowella Dell, MD, FACEP ? ?CSN: [id number] ?MRN: [id number] ?ARRIVAL: 12/30/21 at 2028 ?ROOM: [facility] ? ? ?CHIEF COMPLAINT  ?Rash and Shortness of Breath ? ? ?HISTORY OF PRESENT ILLNESS  ?12/30/21 10:56 PM ?Thomas [name] is a 61 y.o. male with a history of asthma.  With about a week of a generalized, hyperpigmented, pruritic, maculopapular rash along with shortness of breath.  He does not know the trigger.  He has had no throat swelling, nausea, vomiting or diarrhea.  His shortness of breath has not been relieved with his albuterol inhaler.  He has been applying Shea butter cream to the rash without significant improvement. ? ? ?Past Medical History:  ?Diagnosis Date  ? Asthma   ? ? ?Past Surgical History:  ?Procedure Laterality Date  ? NO PAST SURGERIES    ? ? ?No family history on file. ? ?Social History  ? ?Tobacco Use  ? Smoking status: Never  ? Smokeless tobacco: Never  ?Vaping Use  ? Vaping Use: Never used  ?Substance Use Topics  ? Alcohol use: No  ? Drug use: No  ? ? ?Prior to Admission medications   ?Medication Sig Start Date End Date Taking? Authorizing Provider  ?cetirizine (ZYRTEC ALLERGY) 10 MG tablet Take 1 tablet (10 mg total) by mouth 2 (two) times daily as needed (For itching). 12/31/21  Yes Zedrick Springsteen, MD  ?predniSONE (STERAPRED UNI-PAK 21 TAB) 10 MG (21) TBPK tablet Take 6 tabs daily for 2 days, then 5 tabs daily for 2 days, then 4 tabs daily for 2 days, then 3 tabs daily for 2 days, then 2 tabs daily for 2 days, then 1 tab daily for 2 days. 12/31/21  Yes Ridhi Hoffert, MD  ?Levalbuterol Tartrate (XOPENEX HFA IN) Inhale into the lungs 2 (two) times daily.    [provider]  ?mometasone-formoterol (DULERA) 100-5 MCG/ACT AERO Inhale 2 puffs into the lungs 2 (two) times daily.    [provider]  ? ? ?Allergies ?Patient has no known allergies. ? ? ?REVIEW OF SYSTEMS  ?Negative except as noted here or in the History of Present  Illness. ? ? ?PHYSICAL EXAMINATION  ?Initial Vital Signs ?Blood pressure (!) 149/81, pulse 61, temperature 97.8 ?F (36.6 ?C), temperature source Oral, resp. rate 18, height 5\' 8"  (1.727 m), weight 76.2 kg, SpO2 100 %. ? ?Examination ?General: Well-developed, well-nourished male in no acute distress; appearance consistent with age of record ?HENT: normocephalic; atraumatic ?Eyes: Normal appearance ?Neck: supple ?Heart: regular rate and rhythm ?Lungs: Decreased air movement without frank wheezing ?Abdomen: soft; nondistended; nontender; bowel sounds present ?Extremities: No deformity; full range of motion; pulses normal ?Neurologic: Awake, alert and oriented; motor function intact in all extremities and symmetric; no facial droop ?Skin: Warm and dry; hyperpigmented, maculopapular rash that is generalized but most prominent and the neck and extremities with relative sparing of the trunk: ? ? ? ? ? ? ? ?Psychiatric: Normal mood and affect ? ? ?RESULTS  ?Summary of this visit's results, reviewed and interpreted by myself: ? ? EKG Interpretation ? ?Date/Time:    ?Ventricular Rate:    ?PR Interval:    ?QRS Duration:   ?QT Interval:    ?QTC Calculation:   ?R Axis:     ?Text Interpretation:   ?  ? ?  ? ?Laboratory Studies: ?No results found for this or any previous visit (from the past 24 hour(s)). ?Imaging Studies: ?DG Chest 2 View ? ?Result Date: 12/30/2021 ?CLINICAL DATA:  Dyspnea EXAM: CHEST - 2 VIEW COMPARISON:  04/23/2021 chest radiograph. FINDINGS: Stable cardiomediastinal silhouette with normal heart size. No pneumothorax. No pleural effusion. Lungs appear clear, with no acute consolidative airspace disease and no pulmonary edema. IMPRESSION: No active cardiopulmonary disease. Electronically Signed   By: Delbert Phenix M.D.   On: 12/30/2021 21:11   ? ?ED COURSE and MDM  ?Nursing notes, initial and subsequent vitals signs, including pulse oximetry, reviewed and interpreted by myself. ? ?Vitals:  ? 12/30/21 2050 12/30/21  2051 12/30/21 2321 12/30/21 2330  ?BP:  (!) 149/81  (!) 156/97  ?Pulse: 61   (!) 59  ?Resp: 18   18  ?Temp: 97.8 ?F (36.6 ?C)     ?TempSrc: Oral     ?SpO2: 100%  100% 100%  ?Weight:  76.2 kg    ?Height:  5\' 8"  (1.727 m)    ? ?Medications  ?ipratropium-albuterol (DUONEB) 0.5-2.5 (3) MG/3ML nebulizer solution 3 mL (3 mLs Nebulization Given 12/30/21 2321)  ?albuterol (VENTOLIN HFA) 108 (90 Base) MCG/ACT inhaler 2 puff (has no administration in time range)  ?predniSONE (DELTASONE) tablet 60 mg (60 mg Oral Given 12/30/21 2314)  ?diphenhydrAMINE (BENADRYL) capsule 50 mg (50 mg Oral Given 12/30/21 2314)  ? ?The rash is nonspecific.  It does not appear to be urticaria.  It appears more consistent with a dermatitis.  The cause is unclear.  We will start him on a steroid taper and antihistamine and have him follow-up with his PCP.  He may require dermatologic and/or allergist follow-up. ? ?11:59 PM ?Patient's breathing improved after albuterol/ipratropium neb treatment.  Patient acknowledges his inhaler is over a-year-old and we will replace it with a fresh inhaler and AeroChamber and instruct him in their use. ? ?PROCEDURES  ?Procedures ? ? ?ED DIAGNOSES  ? ?  ICD-10-CM   ?1. Rash  R21   ?  ?2. Shortness of breath  R06.02   ?  ? ? ? ?  ?Wenceslao Loper, MD ?12/31/21 0006 ? ?

## 2021-12-31 MED ORDER — HYDROCORTISONE 1 % EX CREA
TOPICAL_CREAM | Freq: Two times a day (BID) | CUTANEOUS | Status: DC
  Administered 2021-12-31: 1 via TOPICAL
  Filled 2021-12-31: qty 28

## 2021-12-31 MED ORDER — CETIRIZINE HCL 10 MG PO TABS: 10.0000 mg | ORAL_TABLET | Freq: Two times a day (BID) | ORAL | 0 refills | Status: DC | PRN

## 2021-12-31 MED ORDER — PREDNISONE 10 MG (21) PO TBPK: ORAL_TABLET | ORAL | 0 refills | Status: DC

## 2022-05-05 ENCOUNTER — Emergency Department (HOSPITAL_COMMUNITY)
Admission: EM | Admit: 2022-05-05 | Discharge: 2022-05-05 | Discharge status: Against Medical Advice | Payer: Managed Care, Other (non HMO) | Attending: Emergency Medicine | Admitting: Emergency Medicine

## 2022-05-05 ENCOUNTER — Other Ambulatory Visit: Payer: Self-pay

## 2022-05-05 ENCOUNTER — Encounter (HOSPITAL_COMMUNITY): Payer: Self-pay

## 2022-05-05 ENCOUNTER — Emergency Department (HOSPITAL_COMMUNITY): Payer: Managed Care, Other (non HMO)

## 2022-05-05 DIAGNOSIS — R2240 Localized swelling, mass and lump, unspecified lower limb: Secondary | ICD-10-CM | POA: Insufficient documentation

## 2022-05-05 DIAGNOSIS — R0602 Shortness of breath: Secondary | ICD-10-CM | POA: Insufficient documentation

## 2022-05-05 DIAGNOSIS — Z5321 Procedure and treatment not carried out due to patient leaving prior to being seen by health care provider: Secondary | ICD-10-CM | POA: Insufficient documentation

## 2022-05-05 DIAGNOSIS — J45909 Unspecified asthma, uncomplicated: Secondary | ICD-10-CM | POA: Diagnosis not present

## 2022-05-05 LAB — D-DIMER, QUANTITATIVE: D-Dimer, Quant: 9.18 ug/mL-FEU — ABNORMAL HIGH (ref 0.00–0.50)

## 2022-05-05 LAB — CBC WITH DIFFERENTIAL/PLATELET
Abs Immature Granulocytes: 0.04 10*3/uL (ref 0.00–0.07)
Basophils Absolute: 0 10*3/uL (ref 0.0–0.1)
Basophils Relative: 0 %
Eosinophils Absolute: 0.7 10*3/uL — ABNORMAL HIGH (ref 0.0–0.5)
Eosinophils Relative: 10 %
HCT: 31.8 % — ABNORMAL LOW (ref 39.0–52.0)
Hemoglobin: 10.3 g/dL — ABNORMAL LOW (ref 13.0–17.0)
Immature Granulocytes: 1 %
Lymphocytes Relative: 11 %
Lymphs Abs: 0.8 10*3/uL (ref 0.7–4.0)
MCH: 33 pg (ref 26.0–34.0)
MCHC: 32.4 g/dL (ref 30.0–36.0)
MCV: 101.9 fL — ABNORMAL HIGH (ref 80.0–100.0)
Monocytes Absolute: 0.4 10*3/uL (ref 0.1–1.0)
Monocytes Relative: 5 %
Neutro Abs: 5.5 10*3/uL (ref 1.7–7.7)
Neutrophils Relative %: 73 %
Platelets: 397 10*3/uL (ref 150–400)
RBC: 3.12 MIL/uL — ABNORMAL LOW (ref 4.22–5.81)
RDW: 14.6 % (ref 11.5–15.5)
WBC: 7.5 10*3/uL (ref 4.0–10.5)
nRBC: 0 % (ref 0.0–0.2)

## 2022-05-05 LAB — COMPREHENSIVE METABOLIC PANEL
ALT: 21 U/L (ref 0–44)
AST: 29 U/L (ref 15–41)
Albumin: 3.3 g/dL — ABNORMAL LOW (ref 3.5–5.0)
Alkaline Phosphatase: 44 U/L (ref 38–126)
Anion gap: 11 (ref 5–15)
BUN: 13 mg/dL (ref 6–20)
CO2: 22 mmol/L (ref 22–32)
Calcium: 8.6 mg/dL — ABNORMAL LOW (ref 8.9–10.3)
Chloride: 105 mmol/L (ref 98–111)
Creatinine, Ser: 1.15 mg/dL (ref 0.61–1.24)
GFR, Estimated: >60 mL/min (ref >60)
Glucose, Bld: 77 mg/dL (ref 70–99)
Potassium: 3.3 mmol/L — ABNORMAL LOW (ref 3.5–5.1)
Sodium: 138 mmol/L (ref 135–145)
Total Bilirubin: 0.8 mg/dL (ref 0.3–1.2)
Total Protein: 7.5 g/dL (ref 6.5–8.1)

## 2022-05-05 LAB — TROPONIN I (HIGH SENSITIVITY): Troponin I (High Sensitivity): 3 ng/L (ref <18)

## 2022-05-05 LAB — BRAIN NATRIURETIC PEPTIDE: B Natriuretic Peptide: 95 pg/mL (ref 0.0–100.0)

## 2022-05-05 NOTE — ED Provider Triage Note (Signed)
Emergency Medicine Provider Triage Evaluation Note  Thomas [name] , a 61 y.o. male  was evaluated in triage.  Pt complains of increasing shortness of breath and lower leg swelling over the last week.  Hx of asthma, however without recent upper respiratory symptoms.  Works as a Administrator.  No known CHF or cardiac history.  Noticed increased difficulty walking due to the swelling/tenderness.  Also with decreased urinary output, though notes bowel movements have been unaffected.  Denies fever or chills.  Review of Systems  Positive:  Negative: See above  Physical Exam  BP (!) 141/78 (BP Location: Left Arm)   Pulse 93   Temp 97.8 F (36.6 C) (Oral)   Resp 18   Ht 5\' 8"  (1.727 m)   Wt 76 kg   SpO2 100%   BMI 25.48 kg/m  Gen:   Awake, no distress   Resp:  Normal effort, mild wheeze bilateral lower lobes MSK:   Moves extremities without difficulty  Other:  Pitting edema bilateral lower extremities to the knee.  Chest non-TTP.  Abdomen soft, non-TTP.  Medical Decision Making  Medically screening exam initiated at 6:30 PM.  Appropriate orders placed.  Thomas [name] was informed that the remainder of the evaluation will be completed by another provider, this initial triage assessment does not replace that evaluation, and the importance of remaining in the ED until their evaluation is complete.     Thomas Rome, PA-C 76/72/09 1835

## 2022-05-05 NOTE — ED Triage Notes (Signed)
Pt reports shob, chills, decreased urine output, and bilateral leg swelling since this am. Relief with elevation of lower extremities.  Hx asthma. Denies chf, cp

## 2022-05-07 ENCOUNTER — Emergency Department (HOSPITAL_BASED_OUTPATIENT_CLINIC_OR_DEPARTMENT_OTHER)
Admission: EM | Admit: 2022-05-07 | Discharge: 2022-05-08 | Disposition: A | Discharge status: Home / Self Care | Payer: Managed Care, Other (non HMO) | Attending: Emergency Medicine | Admitting: Emergency Medicine

## 2022-05-07 ENCOUNTER — Other Ambulatory Visit: Payer: Self-pay

## 2022-05-07 ENCOUNTER — Encounter (HOSPITAL_BASED_OUTPATIENT_CLINIC_OR_DEPARTMENT_OTHER): Payer: Self-pay | Admitting: Emergency Medicine

## 2022-05-07 ENCOUNTER — Emergency Department (HOSPITAL_BASED_OUTPATIENT_CLINIC_OR_DEPARTMENT_OTHER): Payer: Managed Care, Other (non HMO)

## 2022-05-07 DIAGNOSIS — R600 Localized edema: Secondary | ICD-10-CM | POA: Diagnosis not present

## 2022-05-07 DIAGNOSIS — M7989 Other specified soft tissue disorders: Secondary | ICD-10-CM | POA: Diagnosis present

## 2022-05-07 DIAGNOSIS — R0602 Shortness of breath: Secondary | ICD-10-CM | POA: Diagnosis not present

## 2022-05-07 LAB — BASIC METABOLIC PANEL
Anion gap: 7 (ref 5–15)
BUN: 11 mg/dL (ref 6–20)
CO2: 23 mmol/L (ref 22–32)
Calcium: 8.2 mg/dL — ABNORMAL LOW (ref 8.9–10.3)
Chloride: 108 mmol/L (ref 98–111)
Creatinine, Ser: 1.1 mg/dL (ref 0.61–1.24)
GFR, Estimated: >60 mL/min (ref >60)
Glucose, Bld: 95 mg/dL (ref 70–99)
Potassium: 4.4 mmol/L (ref 3.5–5.1)
Sodium: 138 mmol/L (ref 135–145)

## 2022-05-07 LAB — CBC
HCT: 28.9 % — ABNORMAL LOW (ref 39.0–52.0)
Hemoglobin: 9.9 g/dL — ABNORMAL LOW (ref 13.0–17.0)
MCH: 33.7 pg (ref 26.0–34.0)
MCHC: 34.3 g/dL (ref 30.0–36.0)
MCV: 98.3 fL (ref 80.0–100.0)
Platelets: 224 10*3/uL (ref 150–400)
RBC: 2.94 MIL/uL — ABNORMAL LOW (ref 4.22–5.81)
RDW: 14.9 % (ref 11.5–15.5)
WBC: 11.1 10*3/uL — ABNORMAL HIGH (ref 4.0–10.5)
nRBC: 0 % (ref 0.0–0.2)

## 2022-05-07 LAB — BRAIN NATRIURETIC PEPTIDE: B Natriuretic Peptide: 96.6 pg/mL (ref 0.0–100.0)

## 2022-05-07 MED ORDER — FUROSEMIDE 20 MG PO TABS: 20.0000 mg | ORAL_TABLET | Freq: Every day | ORAL | 0 refills | Status: DC

## 2022-05-07 MED ORDER — RIVAROXABAN 15 MG PO TABS: 15.0000 mg | ORAL_TABLET | Freq: Once | ORAL | Status: DC

## 2022-05-07 MED ORDER — FUROSEMIDE 20 MG PO TABS
20.0000 mg | ORAL_TABLET | Freq: Once | ORAL | Status: AC
  Administered 2022-05-07: 20 mg via ORAL
  Filled 2022-05-07: qty 1

## 2022-05-07 MED ORDER — RIVAROXABAN (XARELTO) EDUCATION KIT FOR DVT/PE PATIENTS: PACK | Freq: Once | Status: DC

## 2022-05-07 MED ORDER — RIVAROXABAN (XARELTO) VTE STARTER PACK (15 & 20 MG): 15.0000 mg | ORAL_TABLET | Freq: Every day | ORAL | 0 refills | Status: DC

## 2022-05-07 NOTE — ED Notes (Signed)
ED Provider at bedside. 

## 2022-05-07 NOTE — ED Provider Notes (Signed)
[facility] Provider Note   CSN: [id number] Arrival date & time: 05/07/22  2117     History Chief Complaint  Patient presents with   Shortness of Breath   Leg Swelling    HPI Thomas [name] is a 61 y.o. male presenting for shortness of breath over the past few weeks and bilateral lower extremity swelling would last for 5 days.  He denies fevers or chills nausea vomiting, syncope or chest pain at any time.  He does endorse a history of asthma otherwise minimal medical history is not on calcium channel blockers. He endorses substantial lower extremity edema that is popped up over the last 48 hours, history of mild lymphedema in the past though this is significantly different.  He describes his shortness of breath is episodic in nature, mild.  He states he was able to ambulate around the house without problem but has been having problems going to the store around the block. Patient's recorded medical, surgical, social, medication list and allergies were reviewed in the Snapshot window as part of the initial history.   Review of Systems   Review of Systems  Constitutional:  Positive for fatigue. Negative for chills and fever.  HENT:  Negative for ear pain and sore throat.   Eyes:  Negative for pain and visual disturbance.  Respiratory:  Negative for cough and shortness of breath.   Cardiovascular:  Positive for leg swelling. Negative for chest pain and palpitations.  Gastrointestinal:  Negative for abdominal pain and vomiting.  Genitourinary:  Negative for dysuria and hematuria.  Musculoskeletal:  Negative for arthralgias and back pain.  Skin:  Negative for color change and rash.  Neurological:  Negative for seizures and syncope.  All other systems reviewed and are negative.   Physical Exam Updated Vital Signs BP (!) 141/74 (BP Location: Right Arm)   Pulse 87   Temp 98 F (36.7 C) (Oral)   Resp (!) 25   Ht 5\' 8"  (1.727 m)   Wt 74.8 kg   SpO2  100%   BMI 25.09 kg/m  Physical Exam Vitals and nursing note reviewed.  Constitutional:      General: He is not in acute distress.    Appearance: He is well-developed.  HENT:     Head: Normocephalic and atraumatic.  Eyes:     Conjunctiva/sclera: Conjunctivae normal.  Cardiovascular:     Rate and Rhythm: Normal rate and regular rhythm.     Heart sounds: No murmur heard. Pulmonary:     Effort: Pulmonary effort is normal. No respiratory distress.     Breath sounds: Normal breath sounds.  Abdominal:     Palpations: Abdomen is soft.     Tenderness: There is no abdominal tenderness.  Musculoskeletal:        General: No swelling.     Cervical back: Neck supple.     Right lower leg: Edema present.     Left lower leg: Edema present.  Skin:    General: Skin is warm and dry.     Capillary Refill: Capillary refill takes less than 2 seconds.  Neurological:     Mental Status: He is alert.  Psychiatric:        Mood and Affect: Mood normal.      ED Course/ Medical Decision Making/ A&P    Procedures Procedures   Medications Ordered in ED Medications  furosemide (LASIX) tablet 20 mg (20 mg Oral Given 05/07/22 2248)    Medical Decision Making:  Thomas [name] is a 61 y.o. male who presented to the ED today with BL LE edema and episodic detailed above.     Patient's presentation is complicated by their history of asthma, advanced age.  Patient placed on continuous vitals and telemetry monitoring while in ED which was reviewed periodically.   Complete initial physical exam performed, notably the patient  was hemodynamically stable in no acute distress.  He has diffuse pitting edema of the bilateral lower extremities.      Reviewed and confirmed nursing documentation for past medical history, family history, social history.    Initial Assessment:   With the patient's presentation of new lower extremity edema, most likely diagnosis is lymphedema given patient's history of  similar. Other diagnoses were considered including (but not limited to) nephrotic syndrome, hepatic failure, bilateral DVT also considered but seem grossly less likely given the amount of edema and history of lymphedema. These are considered less likely due to history of present illness and physical exam findings.   This is most consistent with an acute life/limb threatening illness complicated by underlying chronic conditions. This is complicated by a positive D-dimer in the outside hospital 2 days ago.  Patient left with out being seen from that visit. Given the bilateral lower extremity swelling and the positive D-dimer, DVT does remain on the differential.  Unfortunately his facility is unable to evaluate for lower extremity blood clots overnight.  He has no clinical findings of pulmonary embolism with a normal heart rate and oxygen saturation of 100%.   Initial Plan:  DVT ultrasound ordered Screening labs including CBC and Metabolic panel to evaluate for infectious or metabolic etiology of disease.  Urinalysis with reflex culture ordered to evaluate for UTI or relevant urologic/nephrologic pathology.  CXR to evaluate for structural/infectious intrathoracic pathology.  EKG and BNP to evaluate for cardiac pathology. Objective evaluation as below reviewed with plan for close reassessment  Initial Study Results:   Laboratory  All laboratory results reviewed without evidence of clinically relevant pathology.     EKG EKG was reviewed independently. Rate, rhythm, axis, intervals all examined and without medically relevant abnormality. ST segments without concerns for elevations.    Radiology  All images reviewed independently. Agree with radiology report at this time.   DG Chest 2 View  Result Date: 05/07/2022 CLINICAL DATA:  Shortness of breath and bilateral lower extremity swelling. EXAM: CHEST - 2 VIEW COMPARISON:  May 05, 2022 FINDINGS: The heart size and mediastinal contours are  within normal limits. Both lungs are clear. The visualized skeletal structures are unremarkable. IMPRESSION: No active cardiopulmonary disease. Electronically Signed   By: Aram Candela M.D.   On: 05/07/2022 22:15      Final Assessment and Plan:   Patient's presentation is likely related to volume overload of his lower extremity likely secondary to lymphedema.  Ultrasound completed today without evidence of acute blood clot.  Positive dimer from prior visit likely incidental.  We will continue with plan for slight diuresis, compression stockings and close reassessment with primary care provider within 48 hours.    Clinical Impression:  1. Leg edema   2. Leg swelling      Data Unavailable   Final Clinical Impression(s) / ED Diagnoses Final diagnoses:  Leg edema  Leg swelling    Rx / DC Orders ED Discharge Orders          Ordered    furosemide (LASIX) 20 MG tablet  Daily  05/07/22 2237    US Venous Img Lower Bilateral        05/07/22 2252    RIVAROXABAN (XARELTO) VTE STARTER PACK (15 & 20 MG)  Daily        05/07/22 2258              Glyn Ade, MD 05/08/22 0003

## 2022-05-07 NOTE — ED Triage Notes (Signed)
C/o BLLE edema that started today. Pt states when he noticed his legs were swollen, he became shob. Denies cp, cough, dizziness.

## 2022-05-07 NOTE — ED Notes (Signed)
Patient transported to X-ray 

## 2022-05-07 NOTE — Discharge Instructions (Addendum)
You are seen today for leg swelling.  Your evaluation is overall reassuring.  No evidence of acute emergency today.  I do recommend you follow-up closely with your primary care provider within the next 48 hours.  You may start Lasix tomorrow 20 mg once daily for the next 10 days or until you see your primary care provider.  Please start using compression stockings to reduce hemorrhage fluid is collecting in her legs.  Follow-up close with your primary doctor. Return with any worsening symptoms including fevers or chills nausea or vomiting, syncope or shortness of breath.  Thank for the opportunity to participate in your care, Tretha Sciara MD

## 2022-05-08 ENCOUNTER — Telehealth (HOSPITAL_BASED_OUTPATIENT_CLINIC_OR_DEPARTMENT_OTHER): Payer: Self-pay

## 2022-07-07 DIAGNOSIS — L209 Atopic dermatitis, unspecified: Secondary | ICD-10-CM | POA: Diagnosis not present

## 2022-08-19 DIAGNOSIS — L209 Atopic dermatitis, unspecified: Secondary | ICD-10-CM | POA: Diagnosis not present

## 2022-09-08 DIAGNOSIS — L309 Dermatitis, unspecified: Secondary | ICD-10-CM | POA: Diagnosis not present

## 2022-09-15 DIAGNOSIS — J069 Acute upper respiratory infection, unspecified: Secondary | ICD-10-CM | POA: Diagnosis not present

## 2022-09-15 DIAGNOSIS — Z20822 Contact with and (suspected) exposure to covid-19: Secondary | ICD-10-CM | POA: Diagnosis not present

## 2022-09-15 DIAGNOSIS — H1033 Unspecified acute conjunctivitis, bilateral: Secondary | ICD-10-CM | POA: Diagnosis not present

## 2022-09-22 DIAGNOSIS — L209 Atopic dermatitis, unspecified: Secondary | ICD-10-CM | POA: Diagnosis not present

## 2022-10-06 DIAGNOSIS — L209 Atopic dermatitis, unspecified: Secondary | ICD-10-CM | POA: Diagnosis not present

## 2022-10-13 ENCOUNTER — Encounter: Payer: Self-pay | Admitting: Family Medicine

## 2022-10-13 ENCOUNTER — Ambulatory Visit: Payer: BC Managed Care – PPO | Admitting: Family Medicine

## 2022-10-13 ENCOUNTER — Telehealth: Payer: Self-pay | Admitting: Family Medicine

## 2022-10-13 VITALS — BP 118/70 | HR 84 | Temp 98.5°F | Ht 68.25 in | Wt 168.2 lb

## 2022-10-13 DIAGNOSIS — D649 Anemia, unspecified: Secondary | ICD-10-CM | POA: Diagnosis not present

## 2022-10-13 DIAGNOSIS — Z1211 Encounter for screening for malignant neoplasm of colon: Secondary | ICD-10-CM | POA: Diagnosis not present

## 2022-10-13 DIAGNOSIS — Z13220 Encounter for screening for lipoid disorders: Secondary | ICD-10-CM

## 2022-10-13 DIAGNOSIS — H1033 Unspecified acute conjunctivitis, bilateral: Secondary | ICD-10-CM

## 2022-10-13 DIAGNOSIS — J4540 Moderate persistent asthma, uncomplicated: Secondary | ICD-10-CM

## 2022-10-13 MED ORDER — ERYTHROMYCIN 5 MG/GM OP OINT: 1.0000 | TOPICAL_OINTMENT | Freq: Every day | OPHTHALMIC | 0 refills | Status: DC

## 2022-10-13 MED ORDER — ERYTHROMYCIN 5 MG/GM OP OINT: 1.0000 | TOPICAL_OINTMENT | Freq: Four times a day (QID) | OPHTHALMIC | 0 refills | Status: AC

## 2022-10-13 NOTE — Telephone Encounter (Signed)
Prescription Request  10/13/2022  Is this a "Controlled Substance" medicine? No  LOV: 10/13/2022 (new patient)  What is the name of the medication or equipment? albuterol (PROVENTIL HFA) 108 (90 Base) MCG/ACT inhaler   Have you contacted your pharmacy to request a refill? No   Which pharmacy would you like this sent to?  [id number] [place]Phone: [phone]: [phone]   Patient notified that their request is being sent to the clinical staff for review and that they should receive a response within 2 business days.   Please advise at Mobile [phone](mobile)

## 2022-10-13 NOTE — Progress Notes (Unsigned)
New Patient Office Visit  Subjective    Patient ID: Thomas [name], male    DOB: 11-01-1960  Age: 62 y.o. MRN: [id number]  CC:  Chief Complaint  Patient presents with   Establish Care    HPI Thomas [name] presents to establish care Pt was seeing Dr. Ishmael Holter at the allergy center.  Patient reports his eyes are having trouble. States that the whites of his eyes are very red and inflamed. States it has been going on for weeks this way. Is on cetirizine and inhalers for his moderate persistent asthma. States that sometimes his eyes feel like sandpaper. Some mild blurry vision. Pt reports he has an eye doctor, hasn't been to him yet.   I reviewed the patient's medications and current problem list. He has a history of allergies and moderate persistent asthma, he is taking dupixent injections from his allergist monthly.   Outpatient Encounter Medications as of 10/13/2022  Medication Sig   Levalbuterol Tartrate (XOPENEX HFA IN) Inhale into the lungs 2 (two) times daily.   mometasone-formoterol (DULERA) 100-5 MCG/ACT AERO Inhale 2 puffs into the lungs 2 (two) times daily.   SYMBICORT 80-4.5 MCG/ACT inhaler Inhale 2 puffs into the lungs 2 (two) times daily.   [DISCONTINUED] albuterol (PROVENTIL HFA) 108 (90 Base) MCG/ACT inhaler Inhale into the lungs as needed for wheezing or shortness of breath.   [DISCONTINUED] erythromycin ophthalmic ointment Place 1 Application into both eyes at bedtime for 7 days.   albuterol (PROVENTIL HFA) 108 (90 Base) MCG/ACT inhaler Inhale 2 puffs into the lungs every 4 (four) hours as needed for wheezing or shortness of breath.   erythromycin ophthalmic ointment Place 1 Application into both eyes 4 (four) times daily for 7 days.   [DISCONTINUED] cetirizine (ZYRTEC ALLERGY) 10 MG tablet Take 1 tablet (10 mg total) by mouth 2 (two) times daily as needed (For itching).   [DISCONTINUED] furosemide (LASIX) 20 MG tablet Take 1 tablet (20 mg total) by mouth daily for 10  days.   [DISCONTINUED] predniSONE (STERAPRED UNI-PAK 21 TAB) 10 MG (21) TBPK tablet Take 6 tabs daily for 2 days, then 5 tabs daily for 2 days, then 4 tabs daily for 2 days, then 3 tabs daily for 2 days, then 2 tabs daily for 2 days, then 1 tab daily for 2 days.   No facility-administered encounter medications on file as of 10/13/2022.    Past Medical History:  Diagnosis Date   Asthma     Past Surgical History:  Procedure Laterality Date   KNEE ARTHROSCOPY Right    2015   NO PAST SURGERIES      History reviewed. No pertinent family history.  Social History   Socioeconomic History   Marital status: Married    Spouse name: Not on file   Number of children: Not on file   Years of education: Not on file   Highest education level: Not on file  Occupational History   Occupation: truck driver  Tobacco Use   Smoking status: Never   Smokeless tobacco: Never  Vaping Use   Vaping Use: Never used  Substance and Sexual Activity   Alcohol use: No   Drug use: No   Sexual activity: Yes  Other Topics Concern   Not on file  Social History Narrative   Not on file   Social Determinants of Health   Financial Resource Strain: Not on file  Food Insecurity: Not on file  Transportation Needs: Not on file  Physical Activity:  Not on file  Stress: Not on file  Social Connections: Not on file  Intimate Partner Violence: Not on file    Review of Systems  All other systems reviewed and are negative.       Objective    BP 118/70 (BP Location: Left Arm, Patient Position: Sitting, Cuff Size: Normal)   Pulse 84   Temp 98.5 F (36.9 C) (Oral)   Ht 5' 8.25" (1.734 m)   Wt 168 lb 3.2 oz (76.3 kg)   SpO2 98%   BMI 25.39 kg/m   Physical Exam Vitals reviewed.  Constitutional:      Appearance: Normal appearance. He is normal weight.  HENT:     Mouth/Throat:     Mouth: Mucous membranes are moist.     Pharynx: No posterior oropharyngeal erythema.  Eyes:     General:         Right eye: Discharge present.        Left eye: Discharge present.    Extraocular Movements: Extraocular movements intact.     Conjunctiva/sclera:     Right eye: Right conjunctiva is injected. Exudate present.     Left eye: Left conjunctiva is injected. Exudate present.  Cardiovascular:     Rate and Rhythm: Normal rate and regular rhythm.     Pulses: Normal pulses.     Heart sounds: Normal heart sounds. No murmur heard. Pulmonary:     Effort: Pulmonary effort is normal.     Breath sounds: Normal breath sounds. No wheezing or rales.  Abdominal:     General: Bowel sounds are normal.  Lymphadenopathy:     Cervical: No cervical adenopathy.  Skin:    General: Skin is warm and dry.  Neurological:     Mental Status: He is alert and oriented to person, place, and time. Mental status is at baseline.  Psychiatric:        Mood and Affect: Mood normal.        Behavior: Behavior normal.         Assessment & Plan:   Problem List Items Addressed This Visit       Unprioritized   Moderate persistent asthma (Chronic)    Sees allergist for this and is on multiple medications for AR and asthma, getting dupixent injections monthly. Lungs clear on exam, will continue meds as prescribed.       Relevant Medications   SYMBICORT 80-4.5 MCG/ACT inhaler   albuterol (PROVENTIL HFA) 108 (90 Base) MCG/ACT inhaler   Acute bacterial conjunctivitis of both eyes - Primary    Severeal week duration, severe with discharge from both eyes and severe erythema of both eyes. Will treat with erythromycin eye ointment to be applied TID for at least 7 days. Patient was instructed to call me if there is no improvement. Does not seem to be allergic (pt already on antihistamines)  or viral at this time.       Relevant Medications   erythromycin ophthalmic ointment   Anemia    Seen on last set of bloodwork from 04/2022, his needs follow up bloodwork to determine if still present.       Relevant Orders   CBC (no  diff)   CMP   Other Visit Diagnoses     Colon cancer screening       Relevant Orders   Ambulatory referral to Gastroenterology   Lipid screening       Relevant Orders   Lipid Panel       Return in  about 1 year (around 10/14/2023) for annual physical exam.   Farrel Conners, MD

## 2022-10-14 DIAGNOSIS — J4540 Moderate persistent asthma, uncomplicated: Secondary | ICD-10-CM | POA: Insufficient documentation

## 2022-10-14 DIAGNOSIS — H1033 Unspecified acute conjunctivitis, bilateral: Secondary | ICD-10-CM | POA: Insufficient documentation

## 2022-10-14 DIAGNOSIS — D649 Anemia, unspecified: Secondary | ICD-10-CM | POA: Insufficient documentation

## 2022-10-14 LAB — LIPID PANEL
Cholesterol: 190 mg/dL (ref 0–200)
HDL: 58.9 mg/dL (ref >39.00)
LDL Cholesterol: 120 mg/dL — ABNORMAL HIGH (ref 0–99)
NonHDL: 130.61
Total CHOL/HDL Ratio: 3
Triglycerides: 51 mg/dL (ref 0.0–149.0)
VLDL: 10.2 mg/dL (ref 0.0–40.0)

## 2022-10-14 LAB — COMPREHENSIVE METABOLIC PANEL
ALT: 20 U/L (ref 0–53)
AST: 26 U/L (ref 0–37)
Albumin: 3.8 g/dL (ref 3.5–5.2)
Alkaline Phosphatase: 51 U/L (ref 39–117)
BUN: 9 mg/dL (ref 6–23)
CO2: 27 mEq/L (ref 19–32)
Calcium: 9.6 mg/dL (ref 8.4–10.5)
Chloride: 104 mEq/L (ref 96–112)
Creatinine, Ser: 1.06 mg/dL (ref 0.40–1.50)
GFR: 76.04 mL/min (ref >60.00)
Glucose, Bld: 58 mg/dL — ABNORMAL LOW (ref 70–99)
Potassium: 4.3 mEq/L (ref 3.5–5.1)
Sodium: 137 mEq/L (ref 135–145)
Total Bilirubin: 0.6 mg/dL (ref 0.2–1.2)
Total Protein: 8.1 g/dL (ref 6.0–8.3)

## 2022-10-14 LAB — CBC
HCT: 35.3 % — ABNORMAL LOW (ref 39.0–52.0)
Hemoglobin: 11.7 g/dL — ABNORMAL LOW (ref 13.0–17.0)
MCHC: 33.1 g/dL (ref 30.0–36.0)
MCV: 103.6 fl — ABNORMAL HIGH (ref 78.0–100.0)
Platelets: 308 10*3/uL (ref 150.0–400.0)
RBC: 3.41 Mil/uL — ABNORMAL LOW (ref 4.22–5.81)
RDW: 16 % — ABNORMAL HIGH (ref 11.5–15.5)
WBC: 6.5 10*3/uL (ref 4.0–10.5)

## 2022-10-14 MED ORDER — ALBUTEROL SULFATE HFA 108 (90 BASE) MCG/ACT IN AERS: 2.0000 | INHALATION_SPRAY | RESPIRATORY_TRACT | 2 refills | Status: DC | PRN

## 2022-10-14 NOTE — Assessment & Plan Note (Signed)
Sees allergist for this and is on multiple medications for AR and asthma, getting dupixent injections monthly. Lungs clear on exam, will continue meds as prescribed.

## 2022-10-14 NOTE — Assessment & Plan Note (Signed)
Seen on last set of bloodwork from 04/2022, his needs follow up bloodwork to determine if still present.

## 2022-10-14 NOTE — Assessment & Plan Note (Signed)
Severeal week duration, severe with discharge from both eyes and severe erythema of both eyes. Will treat with erythromycin eye ointment to be applied TID for at least 7 days. Patient was instructed to call me if there is no improvement. Does not seem to be allergic (pt already on antihistamines)  or viral at this time.

## 2022-10-16 ENCOUNTER — Telehealth: Payer: Self-pay | Admitting: Family Medicine

## 2022-10-16 NOTE — Telephone Encounter (Signed)
Spouse returning call for results

## 2022-10-17 NOTE — Telephone Encounter (Signed)
See results note. 

## 2022-10-20 DIAGNOSIS — L209 Atopic dermatitis, unspecified: Secondary | ICD-10-CM | POA: Diagnosis not present

## 2022-11-03 DIAGNOSIS — L209 Atopic dermatitis, unspecified: Secondary | ICD-10-CM | POA: Diagnosis not present

## 2022-11-13 ENCOUNTER — Encounter: Payer: Self-pay | Admitting: Family Medicine

## 2022-12-04 DIAGNOSIS — L209 Atopic dermatitis, unspecified: Secondary | ICD-10-CM | POA: Diagnosis not present

## 2022-12-18 DIAGNOSIS — L209 Atopic dermatitis, unspecified: Secondary | ICD-10-CM | POA: Diagnosis not present

## 2023-05-12 ENCOUNTER — Other Ambulatory Visit: Payer: Self-pay

## 2023-05-12 ENCOUNTER — Encounter (HOSPITAL_BASED_OUTPATIENT_CLINIC_OR_DEPARTMENT_OTHER): Payer: Self-pay | Admitting: Pediatrics

## 2023-05-12 ENCOUNTER — Emergency Department (HOSPITAL_BASED_OUTPATIENT_CLINIC_OR_DEPARTMENT_OTHER)
Admission: EM | Admit: 2023-05-12 | Discharge: 2023-05-12 | Disposition: A | Discharge status: Home / Self Care | Payer: Managed Care, Other (non HMO) | Attending: Emergency Medicine | Admitting: Emergency Medicine

## 2023-05-12 DIAGNOSIS — J4541 Moderate persistent asthma with (acute) exacerbation: Secondary | ICD-10-CM | POA: Insufficient documentation

## 2023-05-12 DIAGNOSIS — R0602 Shortness of breath: Secondary | ICD-10-CM | POA: Diagnosis present

## 2023-05-12 MED ORDER — IPRATROPIUM-ALBUTEROL 0.5-2.5 (3) MG/3ML IN SOLN
3.0000 mL | Freq: Once | RESPIRATORY_TRACT | Status: AC
  Administered 2023-05-12: 3 mL via RESPIRATORY_TRACT
  Filled 2023-05-12: qty 3

## 2023-05-12 MED ORDER — PREDNISONE 50 MG PO TABS
60.0000 mg | ORAL_TABLET | Freq: Once | ORAL | Status: AC
  Administered 2023-05-12: 60 mg via ORAL
  Filled 2023-05-12: qty 1

## 2023-05-12 MED ORDER — PREDNISONE 20 MG PO TABS: ORAL_TABLET | ORAL | 0 refills | Status: DC

## 2023-05-12 MED ORDER — ALBUTEROL SULFATE HFA 108 (90 BASE) MCG/ACT IN AERS
2.0000 | INHALATION_SPRAY | Freq: Once | RESPIRATORY_TRACT | Status: AC
  Administered 2023-05-12: 2 via RESPIRATORY_TRACT
  Filled 2023-05-12: qty 6.7

## 2023-05-12 NOTE — ED Triage Notes (Signed)
C/O asthma since last night, has some inhaler at home, and used it PTA with no relief.

## 2023-05-12 NOTE — ED Provider Notes (Signed)
EMERGENCY DEPARTMENT AT [facility] Provider Note   CSN: [id number] Arrival date & time: 05/12/23  1313     History  Chief Complaint  Patient presents with   Asthma    Thomas Maley Sr. is a 62 y.o. male.  62 yo M with a chief complaints of an asthma exacerbation.  The patient thought he felt normal yesterday and then when he started working today realized he was struggling to get his work accomplished.  And felt very short of breath and then realized it was like his prior asthma.  He has gotten about half of a DuoNeb by my examination and he feels quite a bit better.  He denies fevers.  Denies contacts.   Asthma       Home Medications Prior to Admission medications   Medication Sig Start Date End Date Taking? Authorizing Provider  predniSONE (DELTASONE) 20 MG tablet 2 tabs po daily x 4 days 05/12/23  Yes Melene Plan, DO  albuterol (PROVENTIL HFA) 108 (90 Base) MCG/ACT inhaler Inhale 2 puffs into the lungs every 4 (four) hours as needed for wheezing or shortness of breath. 10/14/22   Karie Georges, MD  Levalbuterol Tartrate [facility] HFA IN) Inhale into the lungs 2 (two) times daily.    [provider]  mometasone-formoterol (DULERA) 100-5 MCG/ACT AERO Inhale 2 puffs into the lungs 2 (two) times daily.    [provider]  SYMBICORT 80-4.5 MCG/ACT inhaler Inhale 2 puffs into the lungs 2 (two) times daily. 04/30/22   [provider]      Allergies    Patient has no known allergies.    Review of Systems   Review of Systems  Physical Exam Updated Vital Signs BP (!) 148/84 (BP Location: Left Arm)   Pulse 68   Temp 98.6 F (37 C)   Resp 19   Ht 5\' 8"  (1.727 m)   Wt 74.8 kg   SpO2 100%   BMI 25.09 kg/m  Physical Exam Vitals and nursing note reviewed.  Constitutional:      Appearance: He is well-developed.  HENT:     Head: Normocephalic and atraumatic.  Eyes:     Pupils: Pupils are equal, round, and reactive to  light.  Neck:     Vascular: No JVD.  Cardiovascular:     Rate and Rhythm: Normal rate and regular rhythm.     Heart sounds: No murmur heard.    No friction rub. No gallop.  Pulmonary:     Effort: No respiratory distress.     Breath sounds: No wheezing.     Comments: Slightly diminished breath sounds with end expiratory wheezes and prolonged expiratory effort. Abdominal:     General: There is no distension.     Tenderness: There is no abdominal tenderness. There is no guarding or rebound.  Musculoskeletal:        General: Normal range of motion.     Cervical back: Normal range of motion and neck supple.  Skin:    Coloration: Skin is not pale.     Findings: No rash.  Neurological:     Mental Status: He is alert and oriented to person, place, and time.  Psychiatric:        Behavior: Behavior normal.     ED Results / Procedures / Treatments   Labs (all labs ordered are listed, but only abnormal results are displayed) Labs Reviewed - No data to display  EKG None  Radiology No results  found.  Procedures Procedures    Medications Ordered in ED Medications  albuterol (VENTOLIN HFA) 108 (90 Base) MCG/ACT inhaler 2 puff (has no administration in time range)  ipratropium-albuterol (DUONEB) 0.5-2.5 (3) MG/3ML nebulizer solution 3 mL (3 mLs Nebulization Given 05/12/23 1329)  predniSONE (DELTASONE) tablet 60 mg (60 mg Oral Given 05/12/23 1414)  ipratropium-albuterol (DUONEB) 0.5-2.5 (3) MG/3ML nebulizer solution 3 mL (3 mLs Nebulization Given 05/12/23 1414)    ED Course/ Medical Decision Making/ A&P                                 Medical Decision Making Risk Prescription drug management.   62 yo M with a chief complaints of difficulty breathing.  Feels like his prior asthma exacerbations.  He noticed it this morning.  He has gotten about half of a DuoNeb and feels a bit better.  His lungs are mostly clear for me.  Will finish this DuoNeb and give a dose of oral steroids and  reassess.  Patient feeling a bit better after this DuoNeb but feels like he needs 1 more.  Was given a second DuoNeb with continued improvement.  He would like to go home at this time.  Burst of steroids.  He tells me that he is almost run out of his home inhaler we will give him 1 here to take home.  2:30 PM:  I have discussed the diagnosis/risks/treatment options with the patient and family.  Evaluation and diagnostic testing in the emergency department does not suggest an emergent condition requiring admission or immediate intervention beyond what has been performed at this time.  They will follow up with PCP. We also discussed returning to the ED immediately if new or worsening sx occur. We discussed the sx which are most concerning (e.g., sudden worsening pain, fever, inability to tolerate by mouth, worsening sob, need to use inhaler more often than every 4 hours) that necessitate immediate return. Medications administered to the patient during their visit and any new prescriptions provided to the patient are listed below.  Medications given during this visit Medications  albuterol (VENTOLIN HFA) 108 (90 Base) MCG/ACT inhaler 2 puff (has no administration in time range)  ipratropium-albuterol (DUONEB) 0.5-2.5 (3) MG/3ML nebulizer solution 3 mL (3 mLs Nebulization Given 05/12/23 1329)  predniSONE (DELTASONE) tablet 60 mg (60 mg Oral Given 05/12/23 1414)  ipratropium-albuterol (DUONEB) 0.5-2.5 (3) MG/3ML nebulizer solution 3 mL (3 mLs Nebulization Given 05/12/23 1414)     The patient appears reasonably screen and/or stabilized for discharge and I doubt any other medical condition or other [facility] requiring further screening, evaluation, or treatment in the ED at this time prior to discharge.          Final Clinical Impression(s) / ED Diagnoses Final diagnoses:  Moderate persistent asthma with exacerbation    Rx / DC Orders ED Discharge Orders          Ordered    predniSONE (DELTASONE) 20  MG tablet        05/12/23 1429              Melene Plan, DO 05/12/23 1430

## 2023-05-12 NOTE — Discharge Instructions (Signed)
Use your inhaler every 4 hours(6 puffs) while awake, return for sudden worsening shortness of breath, or if you need to use your inhaler more often.

## 2023-05-12 NOTE — ED Notes (Signed)
Brett Canales RT at bedside administering duoneb via [facility]

## 2023-08-27 ENCOUNTER — Encounter (HOSPITAL_BASED_OUTPATIENT_CLINIC_OR_DEPARTMENT_OTHER): Payer: Self-pay

## 2023-08-27 ENCOUNTER — Other Ambulatory Visit: Payer: Self-pay

## 2023-08-27 ENCOUNTER — Inpatient Hospital Stay (HOSPITAL_BASED_OUTPATIENT_CLINIC_OR_DEPARTMENT_OTHER)
Admission: EM | Admit: 2023-08-27 | Discharge: 2023-08-31 | DRG: 202 | Disposition: A | Discharge status: Home / Self Care | Payer: Managed Care, Other (non HMO) | Attending: Internal Medicine | Admitting: Internal Medicine

## 2023-08-27 ENCOUNTER — Emergency Department (HOSPITAL_BASED_OUTPATIENT_CLINIC_OR_DEPARTMENT_OTHER): Payer: Managed Care, Other (non HMO)

## 2023-08-27 DIAGNOSIS — R000 Tachycardia, unspecified: Secondary | ICD-10-CM | POA: Diagnosis present

## 2023-08-27 DIAGNOSIS — J4540 Moderate persistent asthma, uncomplicated: Secondary | ICD-10-CM | POA: Diagnosis present

## 2023-08-27 DIAGNOSIS — I428 Other cardiomyopathies: Secondary | ICD-10-CM | POA: Diagnosis present

## 2023-08-27 DIAGNOSIS — R7989 Other specified abnormal findings of blood chemistry: Secondary | ICD-10-CM | POA: Diagnosis present

## 2023-08-27 DIAGNOSIS — J4541 Moderate persistent asthma with (acute) exacerbation: Secondary | ICD-10-CM | POA: Diagnosis not present

## 2023-08-27 DIAGNOSIS — J40 Bronchitis, not specified as acute or chronic: Secondary | ICD-10-CM | POA: Diagnosis present

## 2023-08-27 DIAGNOSIS — R0602 Shortness of breath: Secondary | ICD-10-CM | POA: Diagnosis not present

## 2023-08-27 DIAGNOSIS — I21A1 Myocardial infarction type 2: Secondary | ICD-10-CM | POA: Diagnosis present

## 2023-08-27 DIAGNOSIS — Z79899 Other long term (current) drug therapy: Secondary | ICD-10-CM

## 2023-08-27 DIAGNOSIS — I214 Non-ST elevation (NSTEMI) myocardial infarction: Principal | ICD-10-CM

## 2023-08-27 DIAGNOSIS — N179 Acute kidney failure, unspecified: Secondary | ICD-10-CM | POA: Diagnosis present

## 2023-08-27 DIAGNOSIS — Z1152 Encounter for screening for COVID-19: Secondary | ICD-10-CM

## 2023-08-27 DIAGNOSIS — R030 Elevated blood-pressure reading, without diagnosis of hypertension: Secondary | ICD-10-CM | POA: Diagnosis present

## 2023-08-27 DIAGNOSIS — E871 Hypo-osmolality and hyponatremia: Secondary | ICD-10-CM | POA: Diagnosis present

## 2023-08-27 DIAGNOSIS — J45901 Unspecified asthma with (acute) exacerbation: Secondary | ICD-10-CM | POA: Diagnosis present

## 2023-08-27 DIAGNOSIS — Z7951 Long term (current) use of inhaled steroids: Secondary | ICD-10-CM

## 2023-08-27 DIAGNOSIS — D649 Anemia, unspecified: Secondary | ICD-10-CM | POA: Diagnosis present

## 2023-08-27 DIAGNOSIS — E785 Hyperlipidemia, unspecified: Secondary | ICD-10-CM | POA: Diagnosis present

## 2023-08-27 DIAGNOSIS — I10 Essential (primary) hypertension: Secondary | ICD-10-CM | POA: Diagnosis present

## 2023-08-27 LAB — BASIC METABOLIC PANEL
Anion gap: 8 (ref 5–15)
BUN: 10 mg/dL (ref 8–23)
CO2: 24 mmol/L (ref 22–32)
Calcium: 8.4 mg/dL — ABNORMAL LOW (ref 8.9–10.3)
Chloride: 101 mmol/L (ref 98–111)
Creatinine, Ser: 1.12 mg/dL (ref 0.61–1.24)
GFR, Estimated: >60 mL/min (ref >60)
Glucose, Bld: 102 mg/dL — ABNORMAL HIGH (ref 70–99)
Potassium: 3.5 mmol/L (ref 3.5–5.1)
Sodium: 133 mmol/L — ABNORMAL LOW (ref 135–145)

## 2023-08-27 LAB — CBC
HCT: 34.3 % — ABNORMAL LOW (ref 39.0–52.0)
Hemoglobin: 11.4 g/dL — ABNORMAL LOW (ref 13.0–17.0)
MCH: 32.8 pg (ref 26.0–34.0)
MCHC: 33.2 g/dL (ref 30.0–36.0)
MCV: 98.6 fL (ref 80.0–100.0)
Platelets: 203 10*3/uL (ref 150–400)
RBC: 3.48 MIL/uL — ABNORMAL LOW (ref 4.22–5.81)
RDW: 13.2 % (ref 11.5–15.5)
WBC: 5.4 10*3/uL (ref 4.0–10.5)
nRBC: 0 % (ref 0.0–0.2)

## 2023-08-27 LAB — TROPONIN I (HIGH SENSITIVITY)
Troponin I (High Sensitivity): 172 ng/L (ref <18)
Troponin I (High Sensitivity): 462 ng/L (ref <18)

## 2023-08-27 LAB — RESP PANEL BY RT-PCR (RSV, FLU A&B, COVID)  RVPGX2
Influenza A by PCR: NEGATIVE
Influenza B by PCR: NEGATIVE
Resp Syncytial Virus by PCR: NEGATIVE
SARS Coronavirus 2 by RT PCR: NEGATIVE

## 2023-08-27 MED ORDER — IOHEXOL 350 MG/ML SOLN
75.0000 mL | Freq: Once | INTRAVENOUS | Status: AC | PRN
  Administered 2023-08-27: 75 mL via INTRAVENOUS

## 2023-08-27 MED ORDER — ALBUTEROL SULFATE (2.5 MG/3ML) 0.083% IN NEBU: 2.5000 mg | INHALATION_SOLUTION | Freq: Once | RESPIRATORY_TRACT | Status: DC

## 2023-08-27 MED ORDER — LEVALBUTEROL HCL 0.63 MG/3ML IN NEBU
0.6300 mg | INHALATION_SOLUTION | Freq: Once | RESPIRATORY_TRACT | Status: AC
  Administered 2023-08-27: 0.63 mg via RESPIRATORY_TRACT
  Filled 2023-08-27: qty 3

## 2023-08-27 MED ORDER — HEPARIN (PORCINE) 25000 UT/250ML-% IV SOLN
900.0000 [IU]/h | INTRAVENOUS | Status: DC
  Administered 2023-08-27: 900 [IU]/h via INTRAVENOUS
  Filled 2023-08-27: qty 250

## 2023-08-27 MED ORDER — IPRATROPIUM BROMIDE 0.02 % IN SOLN
0.5000 mg | Freq: Once | RESPIRATORY_TRACT | Status: AC
  Administered 2023-08-27: 0.5 mg via RESPIRATORY_TRACT
  Filled 2023-08-27: qty 2.5

## 2023-08-27 MED ORDER — HEPARIN BOLUS VIA INFUSION
4000.0000 [IU] | Freq: Once | INTRAVENOUS | Status: AC
  Administered 2023-08-27: 4000 [IU] via INTRAVENOUS

## 2023-08-27 MED ORDER — IPRATROPIUM-ALBUTEROL 0.5-2.5 (3) MG/3ML IN SOLN: 3.0000 mL | Freq: Once | RESPIRATORY_TRACT | Status: DC

## 2023-08-27 NOTE — Progress Notes (Signed)
 PHARMACY - ANTICOAGULATION CONSULT NOTE  Pharmacy Consult for Heparin  Indication: chest pain/ACS  No Known Allergies  Patient Measurements:   Heparin  Dosing Weight: 75 kg  Vital Signs: Temp: 98.7 F (37.1 C) (01/09 2040) BP: 150/83 (01/09 2309) Pulse Rate: 95 (01/09 2313)  Labs: Recent Labs    08/27/23 2042 08/27/23 2046 08/27/23 2303  HGB 11.4*  --   --   HCT 34.3*  --   --   PLT 203  --   --   CREATININE 1.12  --   --   TROPONINIHS  --  172* 462*    CrCl cannot be calculated (Unknown ideal weight.).   Medical History: Past Medical History:  Diagnosis Date   Asthma     Medications:  No current facility-administered medications on file prior to encounter.   Current Outpatient Medications on File Prior to Encounter  Medication Sig Dispense Refill   albuterol  (PROVENTIL  HFA) 108 (90 Base) MCG/ACT inhaler Inhale 2 puffs into the lungs every 4 (four) hours as needed for wheezing or shortness of breath. 18 g 2   Levalbuterol  Tartrate (XOPENEX  HFA IN) Inhale into the lungs 2 (two) times daily.     mometasone -formoterol  (DULERA ) 100-5 MCG/ACT AERO Inhale 2 puffs into the lungs 2 (two) times daily.     predniSONE  (DELTASONE ) 20 MG tablet 2 tabs po daily x 4 days 8 tablet 0   SYMBICORT 80-4.5 MCG/ACT inhaler Inhale 2 puffs into the lungs 2 (two) times daily.       Assessment: 63 y.o. male with SOB/chest pain for heparin  Goal of Therapy:  Heparin  level 0.3-0.7 units/ml Monitor platelets by anticoagulation protocol: Yes   Plan:  Heparin  4000 units IV bolus, then start heparin  900 units/hr Check heparin  level in 8 hours.   Dail Cordella Misty 08/27/2023,11:38 PM

## 2023-08-27 NOTE — ED Triage Notes (Signed)
 Pt arrives with c/o SOB that started this morning. Pt endorses cough and chest tightness. Pt denies being sick recently. Pt has hx of asthma. Pt used home inhalers without relief.

## 2023-08-27 NOTE — ED Notes (Signed)
 Pt back from CT

## 2023-08-27 NOTE — ED Provider Notes (Addendum)
 I provided a substantive portion of the care of this patient.  I personally made/approved the management plan for this patient and take responsibility for the patient management.     Reports increased shortness of breath and coughing over the past 24 hours.  He has had significant increase in use of his albuterol .  Patient does drive truck.  He assumed it might be due to temperature changes.  He has not had documented fever.  Patient is alert with clear mental status.  He has extensive wheezing with expiratory wheeze.  Soft breath sounds at the bases.  Heart regular borderline tachycardia.  Abdomen soft nontender.  Lower extremities soft nontender without calf pain or popliteal fossa calf pain or swelling.  I reviewed EKG which is unchanged from previous tracing.  Patient's troponin is mildly elevated at 172.  Agree with proceeding with ACS workup, PE study and treatment for asthma.  Patient is not having active chest pain.  Currently oxygen  saturations are in the high 90s.  Patient is mildly tachypneic speaking in full sentences maintaining airway without difficulty.  I agree with plan of management.   Armenta Canning, MD 08/27/23 2204    Armenta Canning, MD 08/27/23 2206

## 2023-08-27 NOTE — ED Notes (Signed)
 Patient transported to CT

## 2023-08-27 NOTE — ED Provider Notes (Signed)
 [facility] Provider Note   CSN: [id number] Arrival date & time: 08/27/23  2033     History  Chief Complaint  Patient presents with   Shortness of Breath    Thomas Pewitt Sr. is a 63 y.o. male with past medical history significant for asthma who presents with concern for shortness of breath that started this morning.  He reports some cough, chest tightness.  He reports recent sickness.  He reports using home inhalers frequently today without significant relief.  He reports some chest tightness,/chest pain that is worse with cough.  No significant chest pain at rest, no chest pain with exertion.  No previous history of ACS, hypertension, hyperlipidemia, tobacco abuse.   Shortness of Breath      Home Medications Prior to Admission medications   Medication Sig Start Date End Date Taking? Authorizing Provider  albuterol  (PROVENTIL  HFA) 108 (90 Base) MCG/ACT inhaler Inhale 2 puffs into the lungs every 4 (four) hours as needed for wheezing or shortness of breath. 10/14/22   Ozell Heron HERO, MD  Levalbuterol  Tartrate (XOPENEX  HFA IN) Inhale into the lungs 2 (two) times daily.    [provider]  mometasone -formoterol  (DULERA ) 100-5 MCG/ACT AERO Inhale 2 puffs into the lungs 2 (two) times daily.    [provider]  predniSONE  (DELTASONE ) 20 MG tablet 2 tabs po daily x 4 days 05/12/23   Floyd, Dan, DO  SYMBICORT 80-4.5 MCG/ACT inhaler Inhale 2 puffs into the lungs 2 (two) times daily. 04/30/22   [provider]      Allergies    Patient has no known allergies.    Review of Systems   Review of Systems  Respiratory:  Positive for shortness of breath.   All other systems reviewed and are negative.   Physical Exam Updated Vital Signs BP (!) 150/83   Pulse 95   Temp 98.7 F (37.1 C)   Resp 20   SpO2 97%  Physical Exam Vitals and nursing note reviewed.  Constitutional:      General: He is not in acute  distress.    Appearance: Normal appearance.  HENT:     Head: Normocephalic and atraumatic.  Eyes:     General:        Right eye: No discharge.        Left eye: No discharge.  Cardiovascular:     Rate and Rhythm: Normal rate and regular rhythm.     Heart sounds: No murmur heard.    No friction rub. No gallop.  Pulmonary:     Effort: Pulmonary effort is normal.     Breath sounds: Normal breath sounds.     Comments: Patient does have some mild biphasic wheezing, no focal consolidation noted.  Increased respiratory effort noted, dry cough noted. Abdominal:     General: Bowel sounds are normal.     Palpations: Abdomen is soft.  Skin:    General: Skin is warm and dry.     Capillary Refill: Capillary refill takes less than 2 seconds.  Neurological:     Mental Status: He is alert and oriented to person, place, and time.  Psychiatric:        Mood and Affect: Mood normal.        Behavior: Behavior normal.     ED Results / Procedures / Treatments   Labs (all labs ordered are listed, but only abnormal results are displayed) Labs Reviewed  BASIC METABOLIC PANEL - Abnormal; Notable  for the following components:      Result Value   Sodium 133 (*)    Glucose, Bld 102 (*)    Calcium 8.4 (*)    All other components within normal limits  CBC - Abnormal; Notable for the following components:   RBC 3.48 (*)    Hemoglobin 11.4 (*)    HCT 34.3 (*)    All other components within normal limits  TROPONIN I (HIGH SENSITIVITY) - Abnormal; Notable for the following components:   Troponin I (High Sensitivity) 172 (*)    All other components within normal limits  TROPONIN I (HIGH SENSITIVITY) - Abnormal; Notable for the following components:   Troponin I (High Sensitivity) 462 (*)    All other components within normal limits  RESP PANEL BY RT-PCR (RSV, FLU A&B, COVID)  RVPGX2  HEPARIN  LEVEL (UNFRACTIONATED)    EKG None  Radiology CT Angio Chest PE W and/or Wo Contrast Result Date:  08/27/2023 CLINICAL DATA:  Shortness of breath and cough, chest tightness EXAM: CT ANGIOGRAPHY CHEST WITH CONTRAST TECHNIQUE: Multidetector CT imaging of the chest was performed using the standard protocol during bolus administration of intravenous contrast. Multiplanar CT image reconstructions and MIPs were obtained to evaluate the vascular anatomy. RADIATION DOSE REDUCTION: This exam was performed according to the departmental dose-optimization program which includes automated exposure control, adjustment of the mA and/or kV according to patient size and/or use of iterative reconstruction technique. CONTRAST:  75mL OMNIPAQUE  IOHEXOL  350 MG/ML SOLN COMPARISON:  Radiographs earlier today and CTA chest 04/23/2021 FINDINGS: Cardiovascular: Negative for acute pulmonary embolism. Normal caliber thoracic aorta. No pericardial effusion. Mediastinum/Nodes: Trachea and esophagus are unremarkable. No thoracic adenopathy Lungs/Pleura: No focal consolidation, pleural effusion, or pneumothorax. Similar mild bronchial wall thickening compared to 04/23/2021. Upper Abdomen: No acute abnormality. Musculoskeletal: No acute fracture. Review of the MIP images confirms the above findings. IMPRESSION: Negative for acute pulmonary embolism. Mild bronchitis/reactive airways. Electronically Signed   By: Norman Gatlin M.D.   On: 08/27/2023 22:28   DG Chest 2 View Result Date: 08/27/2023 CLINICAL DATA:  Chest pain and shortness of breath EXAM: CHEST - 2 VIEW COMPARISON:  05/07/2022 FINDINGS: The heart size and mediastinal contours are within normal limits. Both lungs are clear. The visualized skeletal structures are unremarkable. IMPRESSION: No active cardiopulmonary disease. Electronically Signed   By: Oneil Devonshire M.D.   On: 08/27/2023 20:57    Procedures .Critical Care  Performed by: Rosan Sherlean DEL, PA-C Authorized by: Rosan Sherlean DEL, PA-C   Critical care provider statement:    Critical care time (minutes):  35    Critical care was necessary to treat or prevent imminent or life-threatening deterioration of the following conditions:  Cardiac failure (NSTEMI)   Critical care was time spent personally by me on the following activities:  Development of treatment plan with patient or surrogate, discussions with consultants, evaluation of patient's response to treatment, examination of patient, ordering and review of laboratory studies, ordering and review of radiographic studies, ordering and performing treatments and interventions, pulse oximetry, re-evaluation of patient's condition and review of old charts   Care discussed with: admitting provider       Medications Ordered in ED Medications  heparin  ADULT infusion 100 units/mL (25000 units/250mL) (900 Units/hr Intravenous New Bag/Given 08/27/23 2346)  levalbuterol  (XOPENEX ) nebulizer solution 0.63 mg (0.63 mg Nebulization Given 08/27/23 2153)  ipratropium (ATROVENT ) nebulizer solution 0.5 mg (0.5 mg Nebulization Given 08/27/23 2153)  iohexol  (OMNIPAQUE ) 350 MG/ML injection 75 mL (75 mLs  Intravenous Contrast Given 08/27/23 2216)  heparin  bolus via infusion 4,000 Units (4,000 Units Intravenous Bolus from Bag 08/27/23 2346)    ED Course/ Medical Decision Making/ A&P                                 Medical Decision Making Amount and/or Complexity of Data Reviewed Labs: ordered. Radiology: ordered.  Risk Prescription drug management.   This patient is a 63 y.o. male who presents to the ED for concern of shortness of breath, chest tightness, this involves an extensive number of treatment options, and is a complaint that carries with it a high risk of complications and morbidity. The emergent differential diagnosis prior to evaluation includes, but is not limited to,  asthma exacerbation, COPD exacerbation, acute upper respiratory infection, acute bronchitis, chronic bronchitis, interstitial lung disease, ARDS, PE, pneumonia, atypical ACS, carbon monoxide poisoning,  spontaneous pneumothorax, new CHF vs CHF exacerbation, versus other . This is not an exhaustive differential.   Past Medical History / Co-morbidities / Social History: Asthma, otherwise noncontributory  Physical Exam: Physical exam performed. The pertinent findings include: Patient has been hypertensive in the ED, blood pressure 157/95 on arrival, slightly worsened on recheck, 187/109 most recently.  He has had some intermittent tachycardia, most recent EKG pulse 105.  He was initially somewhat tachypneic with bilateral wheezing, but improved on reevaluation with no more tachypnea, and improvement of wheezing after breathing treatments.  He has maintained oxygen  saturation greater than 95% on room air.  Lab Tests: I ordered, and personally interpreted labs.  The pertinent results include: BMP notable for mild hyponatremia, sodium 133, COVID flu and RSV are all negative on PCR.  CBC with mild anemia, hemoglobin 11.4.  His initial troponin is significantly elevated at 172, he has not had significant tachycardia in the ED to suggest demand ischemia, and no evidence of PE on CT angio, will trend delta troponin, suspect that he will need admission for NSTEMI although unclear etiology at this time.  Delta troponin is elevated at 462, clinically his physical exam suggest asthma exacerbation/bronchitis but he nonetheless has findings consistent with NSTEMI.   Imaging Studies: I ordered imaging studies including plain film radiograph of the chest, CT PE study. I independently visualized and interpreted imaging which showed no evidence of blood clot, some bronchitis changes noted consistent with his initial asthma presentation.. I agree with the radiologist interpretation.   Cardiac Monitoring:  The patient was maintained on a cardiac monitor.  My attending physician Dr. Armenta viewed and interpreted the cardiac monitored which showed an underlying rhythm of: NSR, no significant change from baseline, no acute  ST-T changes at this time. I agree with this interpretation.   Medications: I ordered medication including Xopenex , Atrovent  for wheezing  11:53 PM Care of Ronie Barnhart Theissen Sr. transferred to Dr. Geroldine at the end of my shift as the patient will require reassessment once labs/imaging have resulted. Patient presentation, ED course, and plan of care discussed with review of all pertinent labs and imaging. Please see his/her note for further details regarding further ED course and disposition. Plan at time of handoff is we will need hospital admission, for cardiology admission for NSTEMI of unclear etiology. This may be altered or completely changed at the discretion of the oncoming team pending results of further workup.   Final Clinical Impression(s) / ED Diagnoses Final diagnoses:  NSTEMI (non-ST elevated myocardial infarction) (HCC)  Moderate persistent  asthma with exacerbation    Rx / DC Orders ED Discharge Orders     None         Rosan Sherlean VEAR DEVONNA 08/27/23 2353    Armenta Canning, MD 08/28/23 2200

## 2023-08-28 ENCOUNTER — Other Ambulatory Visit: Payer: Self-pay

## 2023-08-28 ENCOUNTER — Observation Stay (HOSPITAL_COMMUNITY): Payer: Managed Care, Other (non HMO)

## 2023-08-28 DIAGNOSIS — I2489 Other forms of acute ischemic heart disease: Secondary | ICD-10-CM

## 2023-08-28 DIAGNOSIS — R072 Precordial pain: Secondary | ICD-10-CM | POA: Diagnosis not present

## 2023-08-28 DIAGNOSIS — J4541 Moderate persistent asthma with (acute) exacerbation: Secondary | ICD-10-CM | POA: Diagnosis not present

## 2023-08-28 DIAGNOSIS — R0602 Shortness of breath: Secondary | ICD-10-CM | POA: Diagnosis present

## 2023-08-28 DIAGNOSIS — E871 Hypo-osmolality and hyponatremia: Secondary | ICD-10-CM | POA: Diagnosis not present

## 2023-08-28 DIAGNOSIS — N179 Acute kidney failure, unspecified: Secondary | ICD-10-CM | POA: Diagnosis not present

## 2023-08-28 DIAGNOSIS — J40 Bronchitis, not specified as acute or chronic: Secondary | ICD-10-CM | POA: Diagnosis not present

## 2023-08-28 DIAGNOSIS — R051 Acute cough: Secondary | ICD-10-CM | POA: Insufficient documentation

## 2023-08-28 DIAGNOSIS — I428 Other cardiomyopathies: Secondary | ICD-10-CM | POA: Diagnosis not present

## 2023-08-28 DIAGNOSIS — I21A1 Myocardial infarction type 2: Secondary | ICD-10-CM | POA: Diagnosis not present

## 2023-08-28 DIAGNOSIS — R030 Elevated blood-pressure reading, without diagnosis of hypertension: Secondary | ICD-10-CM | POA: Diagnosis present

## 2023-08-28 DIAGNOSIS — I249 Acute ischemic heart disease, unspecified: Secondary | ICD-10-CM | POA: Insufficient documentation

## 2023-08-28 DIAGNOSIS — I5030 Unspecified diastolic (congestive) heart failure: Secondary | ICD-10-CM

## 2023-08-28 DIAGNOSIS — I214 Non-ST elevation (NSTEMI) myocardial infarction: Secondary | ICD-10-CM | POA: Diagnosis not present

## 2023-08-28 DIAGNOSIS — R7989 Other specified abnormal findings of blood chemistry: Secondary | ICD-10-CM

## 2023-08-28 DIAGNOSIS — D649 Anemia, unspecified: Secondary | ICD-10-CM | POA: Diagnosis not present

## 2023-08-28 DIAGNOSIS — Z79899 Other long term (current) drug therapy: Secondary | ICD-10-CM | POA: Diagnosis not present

## 2023-08-28 DIAGNOSIS — I10 Essential (primary) hypertension: Secondary | ICD-10-CM | POA: Diagnosis not present

## 2023-08-28 DIAGNOSIS — E785 Hyperlipidemia, unspecified: Secondary | ICD-10-CM | POA: Diagnosis not present

## 2023-08-28 DIAGNOSIS — J45901 Unspecified asthma with (acute) exacerbation: Secondary | ICD-10-CM

## 2023-08-28 DIAGNOSIS — Z1152 Encounter for screening for COVID-19: Secondary | ICD-10-CM | POA: Diagnosis not present

## 2023-08-28 DIAGNOSIS — R000 Tachycardia, unspecified: Secondary | ICD-10-CM | POA: Diagnosis not present

## 2023-08-28 DIAGNOSIS — Z7951 Long term (current) use of inhaled steroids: Secondary | ICD-10-CM | POA: Diagnosis not present

## 2023-08-28 LAB — ECHOCARDIOGRAM COMPLETE
Area-P 1/2: 2.87 cm2
Calc EF: 46.8 %
S' Lateral: 2.1 cm
Single Plane A2C EF: 52.3 %
Single Plane A4C EF: 43.9 %
Weight: 2638.47 [oz_av]

## 2023-08-28 LAB — HIV ANTIBODY (ROUTINE TESTING W REFLEX): HIV Screen 4th Generation wRfx: NONREACTIVE

## 2023-08-28 LAB — TSH: TSH: 0.315 u[IU]/mL — ABNORMAL LOW (ref 0.350–4.500)

## 2023-08-28 LAB — C-REACTIVE PROTEIN: CRP: 0.6 mg/dL (ref <1.0)

## 2023-08-28 LAB — HEPARIN LEVEL (UNFRACTIONATED): Heparin Unfractionated: 0.51 [IU]/mL (ref 0.30–0.70)

## 2023-08-28 LAB — LIPID PANEL
Cholesterol: 140 mg/dL (ref 0–200)
HDL: 53 mg/dL (ref >40)
LDL Cholesterol: 80 mg/dL (ref 0–99)
Total CHOL/HDL Ratio: 2.6 {ratio}
Triglycerides: 35 mg/dL (ref <150)
VLDL: 7 mg/dL (ref 0–40)

## 2023-08-28 LAB — TROPONIN I (HIGH SENSITIVITY)
Troponin I (High Sensitivity): 643 ng/L (ref <18)
Troponin I (High Sensitivity): 585 ng/L (ref <18)

## 2023-08-28 LAB — HEMOGLOBIN A1C
Hgb A1c MFr Bld: 5.5 % (ref 4.8–5.6)
Mean Plasma Glucose: 111 mg/dL

## 2023-08-28 LAB — SEDIMENTATION RATE: Sed Rate: 42 mm/h — ABNORMAL HIGH (ref 0–16)

## 2023-08-28 MED ORDER — PERFLUTREN LIPID MICROSPHERE
1.0000 mL | INTRAVENOUS | Status: AC | PRN
Start: 2023-08-28 — End: 2023-08-28
  Administered 2023-08-28: 1 mL via INTRAVENOUS

## 2023-08-28 MED ORDER — ACETAMINOPHEN 650 MG RE SUPP: 650.0000 mg | Freq: Four times a day (QID) | RECTAL | Status: DC | PRN

## 2023-08-28 MED ORDER — ACETAMINOPHEN 325 MG PO TABS
650.0000 mg | ORAL_TABLET | Freq: Four times a day (QID) | ORAL | Status: DC | PRN
  Filled 2023-08-28: qty 2

## 2023-08-28 MED ORDER — ONDANSETRON HCL 4 MG/2ML IJ SOLN: 4.0000 mg | Freq: Four times a day (QID) | INTRAMUSCULAR | Status: DC | PRN

## 2023-08-28 MED ORDER — METHYLPREDNISOLONE SODIUM SUCC 125 MG IJ SOLR
80.0000 mg | Freq: Two times a day (BID) | INTRAMUSCULAR | Status: DC
  Administered 2023-08-28 – 2023-08-31 (×6): 80 mg via INTRAVENOUS
  Filled 2023-08-28 (×6): qty 2

## 2023-08-28 MED ORDER — HYDRALAZINE HCL 20 MG/ML IJ SOLN: 5.0000 mg | INTRAMUSCULAR | Status: DC | PRN

## 2023-08-28 MED ORDER — METOPROLOL TARTRATE 25 MG PO TABS
100.0000 mg | ORAL_TABLET | Freq: Once | ORAL | Status: AC
  Administered 2023-08-28: 100 mg via ORAL
  Filled 2023-08-28: qty 4

## 2023-08-28 MED ORDER — IPRATROPIUM BROMIDE 0.02 % IN SOLN
0.5000 mg | Freq: Once | RESPIRATORY_TRACT | Status: AC
  Administered 2023-08-28: 0.5 mg via RESPIRATORY_TRACT
  Filled 2023-08-28: qty 2.5

## 2023-08-28 MED ORDER — SODIUM CHLORIDE 0.9 % IV SOLN: INTRAVENOUS | Status: DC

## 2023-08-28 MED ORDER — OXYCODONE HCL 5 MG PO TABS: 5.0000 mg | ORAL_TABLET | ORAL | Status: DC | PRN

## 2023-08-28 MED ORDER — SODIUM CHLORIDE 0.9% FLUSH
3.0000 mL | Freq: Two times a day (BID) | INTRAVENOUS | Status: DC
  Administered 2023-08-28 – 2023-08-31 (×6): 3 mL via INTRAVENOUS

## 2023-08-28 MED ORDER — ALBUTEROL SULFATE (2.5 MG/3ML) 0.083% IN NEBU
2.5000 mg | INHALATION_SOLUTION | RESPIRATORY_TRACT | Status: DC | PRN
  Administered 2023-08-28 (×2): 2.5 mg via RESPIRATORY_TRACT
  Filled 2023-08-28 (×2): qty 3

## 2023-08-28 MED ORDER — METHYLPREDNISOLONE SODIUM SUCC 125 MG IJ SOLR
125.0000 mg | Freq: Once | INTRAMUSCULAR | Status: AC
  Administered 2023-08-28: 125 mg via INTRAVENOUS
  Filled 2023-08-28: qty 2

## 2023-08-28 MED ORDER — MORPHINE SULFATE (PF) 2 MG/ML IV SOLN: 2.0000 mg | INTRAVENOUS | Status: DC | PRN

## 2023-08-28 MED ORDER — BISACODYL 5 MG PO TBEC: 5.0000 mg | DELAYED_RELEASE_TABLET | Freq: Every day | ORAL | Status: DC | PRN

## 2023-08-28 MED ORDER — NITROGLYCERIN 0.4 MG SL SUBL
0.8000 mg | SUBLINGUAL_TABLET | Freq: Once | SUBLINGUAL | Status: AC
  Administered 2023-08-28: 0.8 mg via SUBLINGUAL

## 2023-08-28 MED ORDER — IOHEXOL 350 MG/ML SOLN
95.0000 mL | Freq: Once | INTRAVENOUS | Status: AC | PRN
  Administered 2023-08-28: 95 mL via INTRAVENOUS

## 2023-08-28 MED ORDER — DOCUSATE SODIUM 100 MG PO CAPS
100.0000 mg | ORAL_CAPSULE | Freq: Two times a day (BID) | ORAL | Status: DC
  Administered 2023-08-28: 100 mg via ORAL
  Filled 2023-08-28 (×4): qty 1

## 2023-08-28 MED ORDER — MOMETASONE FURO-FORMOTEROL FUM 100-5 MCG/ACT IN AERO
2.0000 | INHALATION_SPRAY | Freq: Two times a day (BID) | RESPIRATORY_TRACT | Status: DC
  Administered 2023-08-28 – 2023-08-31 (×6): 2 via RESPIRATORY_TRACT
  Filled 2023-08-28 (×2): qty 8.8

## 2023-08-28 MED ORDER — MAGNESIUM SULFATE 2 GM/50ML IV SOLN
2.0000 g | Freq: Once | INTRAVENOUS | Status: AC
  Administered 2023-08-28: 2 g via INTRAVENOUS
  Filled 2023-08-28: qty 50

## 2023-08-28 MED ORDER — ONDANSETRON HCL 4 MG PO TABS: 4.0000 mg | ORAL_TABLET | Freq: Four times a day (QID) | ORAL | Status: DC | PRN

## 2023-08-28 MED ORDER — POLYETHYLENE GLYCOL 3350 17 G PO PACK: 17.0000 g | PACK | Freq: Every day | ORAL | Status: DC | PRN

## 2023-08-28 MED ORDER — LEVALBUTEROL HCL 1.25 MG/0.5ML IN NEBU: 1.2500 mg | INHALATION_SOLUTION | Freq: Once | RESPIRATORY_TRACT | Status: DC

## 2023-08-28 MED ORDER — LEVALBUTEROL HCL 0.63 MG/3ML IN NEBU
0.6300 mg | INHALATION_SOLUTION | Freq: Once | RESPIRATORY_TRACT | Status: AC
  Administered 2023-08-28: 0.63 mg via RESPIRATORY_TRACT
  Filled 2023-08-28: qty 3

## 2023-08-28 MED ORDER — IPRATROPIUM-ALBUTEROL 0.5-2.5 (3) MG/3ML IN SOLN
3.0000 mL | Freq: Four times a day (QID) | RESPIRATORY_TRACT | Status: DC
  Administered 2023-08-28 – 2023-08-31 (×9): 3 mL via RESPIRATORY_TRACT
  Filled 2023-08-28 (×10): qty 3

## 2023-08-28 MED ORDER — ASPIRIN 81 MG PO CHEW
324.0000 mg | CHEWABLE_TABLET | Freq: Once | ORAL | Status: AC
  Administered 2023-08-28: 324 mg via ORAL
  Filled 2023-08-28: qty 4

## 2023-08-28 MED ORDER — NITROGLYCERIN 0.4 MG SL SUBL
SUBLINGUAL_TABLET | SUBLINGUAL | Status: AC
  Filled 2023-08-28: qty 2

## 2023-08-28 MED ORDER — METOPROLOL TARTRATE 5 MG/5ML IV SOLN
INTRAVENOUS | Status: AC
  Filled 2023-08-28: qty 5

## 2023-08-28 NOTE — ED Provider Notes (Signed)
  Physical Exam  BP (!) 146/90   Pulse 92   Temp 98.5 F (36.9 C) (Oral)   Resp 17   Wt 74.8 kg   SpO2 97%   BMI 25.07 kg/m   Physical Exam Vitals and nursing note reviewed.  Constitutional:      General: He is not in acute distress.    Appearance: He is well-developed. He is diaphoretic.  HENT:     Head: Normocephalic and atraumatic.  Cardiovascular:     Rate and Rhythm: Normal rate and regular rhythm.     Heart sounds: No murmur heard.    No friction rub.  Pulmonary:     Effort: Pulmonary effort is normal. No respiratory distress.     Breath sounds: Normal breath sounds. No wheezing or rales.  Abdominal:     General: Bowel sounds are normal. There is no distension.     Palpations: Abdomen is soft.     Tenderness: There is no abdominal tenderness.  Musculoskeletal:        General: Normal range of motion.     Cervical back: Normal range of motion and neck supple.  Skin:    General: Skin is warm.  Neurological:     Mental Status: He is alert and oriented to person, place, and time.     Coordination: Coordination normal.     Procedures  Procedures  ED Course / MDM    Medical Decision Making Amount and/or Complexity of Data Reviewed Labs: ordered. Radiology: ordered.  Risk OTC drugs. Prescription drug management. Decision regarding hospitalization.   Care assumed from Dr. Ismael at shift change.  Patient presenting with complaints of shortness of breath.  His initial presentation was most consistent with an exacerbation of asthma, however was found to have an elevated troponin of 172.  Patient then underwent a CTA of the chest showing no evidence for pulmonary embolism.  Care signed out to me awaiting return call from cardiology.  His second troponin has returned at 462.  Care was discussed with Dr. Floretta who feels as though patient should be admitted to the cardiology service.  Due to bed availability at [facility], patient has remained here for several more  hours during which time the patient was able to rest.  He did wake up at approximately 5 AM feeling short of breath again and appeared diaphoretic.  I went to reevaluate him and he appeared anxious and dyspneic.  Xopenex  helped him earlier in the evening, so this was administered once again with good results.  Third troponin has returned at 643.  Due to the patient's recurrent breathing issues and rising troponin, I feel as though patient should be transferred to [facility] as an ER to ER transfer to wait on a bed and facilitate his care.  I have spoken with Dr. Bari in the ER who agrees to accept in transfer.       Geroldine Berg, MD 08/28/23 [phone]

## 2023-08-28 NOTE — Progress Notes (Signed)
 PHARMACY - ANTICOAGULATION CONSULT NOTE  Pharmacy Consult for Heparin  Indication: chest pain/ACS  No Known Allergies  Patient Measurements: Weight: 74.8 kg (164 lb 14.5 oz) Heparin  Dosing Weight: 75 kg  Vital Signs: Temp: 98.2 F (36.8 C) (01/10 0734) Temp Source: Oral (01/10 0734) BP: 142/84 (01/10 0734) Pulse Rate: 87 (01/10 0734)  Labs: Recent Labs    08/27/23 2042 08/27/23 2046 08/27/23 2303 08/28/23 0436 08/28/23 0655 08/28/23 0800  HGB 11.4*  --   --   --   --   --   HCT 34.3*  --   --   --   --   --   PLT 203  --   --   --   --   --   HEPARINUNFRC  --   --   --   --   --  0.51  CREATININE 1.12  --   --   --   --   --   TROPONINIHS  --    < > 462* 643* 585*  --    < > = values in this interval not displayed.    Estimated Creatinine Clearance: 67 mL/min (by C-G formula based on SCr of 1.12 mg/dL).   Medical History: Past Medical History:  Diagnosis Date   Asthma     Medications:  No current facility-administered medications on file prior to encounter.   Current Outpatient Medications on File Prior to Encounter  Medication Sig Dispense Refill   albuterol  (PROVENTIL  HFA) 108 (90 Base) MCG/ACT inhaler Inhale 2 puffs into the lungs every 4 (four) hours as needed for wheezing or shortness of breath. 18 g 2   Levalbuterol  Tartrate (XOPENEX  HFA IN) Inhale into the lungs 2 (two) times daily.     mometasone -formoterol  (DULERA ) 100-5 MCG/ACT AERO Inhale 2 puffs into the lungs 2 (two) times daily.     prednisoLONE acetate (PRED FORTE) 1 % ophthalmic suspension Place 1 drop into both eyes 4 (four) times daily.     predniSONE  (DELTASONE ) 20 MG tablet 2 tabs po daily x 4 days 8 tablet 0   SYMBICORT 80-4.5 MCG/ACT inhaler Inhale 2 puffs into the lungs 2 (two) times daily.       Assessment: 63 y.o. male with SOB/chest pain for heparin . Cardiology suggests patient's symptoms are atypical but wants to continue heparin  pending echo and CCTA.  Initial heparin  level  therapeutic: 0.51 on 900 units/hr. No issues with infusion or overt s/sx of bleeding reported.    Goal of Therapy:  Heparin  level 0.3-0.7 units/ml Monitor platelets by anticoagulation protocol: Yes   Plan:  Continue heparin  gtt at 900 units/hr Check heparin  level in 8 hours Daily heparin  level and CBC F/U further cardiology recommendations  Koren Or, PharmD Clinical Pharmacist 08/28/2023 8:55 AM Please check AMION for all [facility] Pharmacy numbers

## 2023-08-28 NOTE — ED Provider Notes (Addendum)
 63 year old male arrives from med [employer] [facility] for admission to cardiology service for NSTEMI.  He was transferred ED to ED evaluated admission due to recurrent shortness of breath.  He was given Xopenex  treatment.  His shortness of breath has significantly improved.  Currently chest pain-free.  Appears comfortable.  Hemodynamically stable currently.  Notified Dr. Michele with cardiology of patient's arrival to [facility].  Most recent troponin 643.  He requests a repeat EKG which I have placed an order for, and notified nurse to obtain.  Patient already on heparin  drip and was previously given aspirin .   Cardiology feels patient should be admitted to medicine.  Discussed with hospitalist service.  They will evaluate patient for admission.  Hildegard Loge, PA-C 08/28/23 0701    Hildegard Loge, PA-C 08/28/23 [id number]    Freddi Hamilton, MD 09/01/23 1501

## 2023-08-28 NOTE — ED Notes (Signed)
 Pts cardiac monitor alarming, this RN to the bedside and noticed the patient having more increased WOB and diaphoretic. EDP (Delo) made aware of the patients acute change. See MAR for intervention. RT made aware.

## 2023-08-28 NOTE — H&P (Signed)
 History and Physical    Patient: Thomas Shiflet Sr. FMW:[id number] DOB: 07/01/1961 DOA: 08/27/2023 DOS: the patient was seen and examined on 08/28/2023 PCP: Ozell Heron HERO, MD  Patient coming from: Home - lives with wife and son; NOK: [place], [place], [phone]   Chief Complaint: SOB  HPI: Thomas Ferencz Sr. is a 63 y.o. male with medical history significant of asthma who presented on 1/19 with SOB.  He reports that he developed acute onset of SOB yesterday.  He has h/o asthma and used his MDI without improvement.  Eventually, with ongoing SOB he decided to come to the ER at [facility].  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       ER Course:  Transferred from [facility] for NSTEMI.  Cardiology reviewed, thinks this is atypical presentation and wants TRH admission.  Troponin peaked, treated with nebs.  Breathing is better.     Review of Systems: As mentioned in the history of present illness. All other systems reviewed and are negative. Past Medical History:  Diagnosis Date   Asthma    Past Surgical History:  Procedure Laterality Date   KNEE ARTHROSCOPY Right    2015   NO PAST SURGERIES     Social History:  reports that he has never smoked. He has never used smokeless tobacco. He reports that he does not drink alcohol and does not use drugs.  No Known Allergies  History reviewed. No pertinent family history.  Prior to Admission medications   Medication Sig Start Date End Date Taking? Authorizing Provider  albuterol  (PROVENTIL  HFA) 108 (90 Base) MCG/ACT inhaler Inhale 2 puffs into the lungs every 4 (four) hours as needed for wheezing or shortness of breath. 10/14/22   Ozell Heron HERO, MD  Levalbuterol  Tartrate (XOPENEX  HFA IN) Inhale into the lungs 2 (two) times daily.    [provider]  mometasone -formoterol  (DULERA ) 100-5 MCG/ACT AERO Inhale 2 puffs into the lungs 2 (two)  times daily.    [provider]  prednisoLONE acetate (PRED FORTE) 1 % ophthalmic suspension Place 1 drop into both eyes 4 (four) times daily. 07/21/23   [provider]  predniSONE  (DELTASONE ) 20 MG tablet 2 tabs po daily x 4 days 05/12/23   Floyd, Dan, DO  SYMBICORT 80-4.5 MCG/ACT inhaler Inhale 2 puffs into the lungs 2 (two) times daily. 04/30/22   [provider]    Physical Exam: Vitals:   08/28/23 0600 08/28/23 0649 08/28/23 0700 08/28/23 0734  BP: (!) 146/90 (!) 157/95 (!) 167/93 (!) 142/84  Pulse: 92 88 94 87  Resp: 17 (!) 23 (!) 23 20  Temp:  97.8 F (36.6 C)  98.2 F (36.8 C)  TempSrc:  Oral  Oral  SpO2: 97% 100% 100% 100%  Weight:       General:  Appears calm but uncomfortable with his breathing, mildly diaphoretic Eyes:  EOMI, normal lids, iris ENT:  grossly normal hearing, lips & tongue, mmm; some absent dentition Neck:  no LAD, masses or thyromegaly Cardiovascular:  RRR, no m/r/g. No LE edema.  Respiratory:   Mild expiratory wheezing with reasonable air movement. Increased respiratory effort. Abdomen:  soft, NT, ND Skin:  no rash or induration seen on limited exam Musculoskeletal:  grossly normal tone BUE/BLE, good ROM, no bony abnormality Psychiatric:  mildly anxious mood and affect, speech fluent and appropriate, AOx3 Neurologic:  CN 2-12 grossly intact, moves all  extremities in coordinated fashion   Radiological Exams on Admission: Independently reviewed - see discussion in A/P where applicable  CT Angio Chest PE W and/or Wo Contrast Result Date: 08/27/2023 CLINICAL DATA:  Shortness of breath and cough, chest tightness EXAM: CT ANGIOGRAPHY CHEST WITH CONTRAST TECHNIQUE: Multidetector CT imaging of the chest was performed using the standard protocol during bolus administration of intravenous contrast. Multiplanar CT image reconstructions and MIPs were obtained to evaluate the vascular anatomy. RADIATION DOSE REDUCTION: This exam was performed  according to the departmental dose-optimization program which includes automated exposure control, adjustment of the mA and/or kV according to patient size and/or use of iterative reconstruction technique. CONTRAST:  75mL OMNIPAQUE  IOHEXOL  350 MG/ML SOLN COMPARISON:  Radiographs earlier today and CTA chest 04/23/2021 FINDINGS: Cardiovascular: Negative for acute pulmonary embolism. Normal caliber thoracic aorta. No pericardial effusion. Mediastinum/Nodes: Trachea and esophagus are unremarkable. No thoracic adenopathy Lungs/Pleura: No focal consolidation, pleural effusion, or pneumothorax. Similar mild bronchial wall thickening compared to 04/23/2021. Upper Abdomen: No acute abnormality. Musculoskeletal: No acute fracture. Review of the MIP images confirms the above findings. IMPRESSION: Negative for acute pulmonary embolism. Mild bronchitis/reactive airways. Electronically Signed   By: Norman Gatlin M.D.   On: 08/27/2023 22:28   DG Chest 2 View Result Date: 08/27/2023 CLINICAL DATA:  Chest pain and shortness of breath EXAM: CHEST - 2 VIEW COMPARISON:  05/07/2022 FINDINGS: The heart size and mediastinal contours are within normal limits. Both lungs are clear. The visualized skeletal structures are unremarkable. IMPRESSION: No active cardiopulmonary disease. Electronically Signed   By: Oneil Devonshire M.D.   On: 08/27/2023 20:57    EKG: Independently reviewed.   1/9 2044 - NSR with rate 97; nonspecific ST changes with no evidence of acute ischemia 1/10 0534 - NSR with rate 97; nonspecific ST changes with no evidence of acute ischemia, NSCSLT 1/10 0701 - NSR with rate 82; nonspecific ST changes with no evidence of acute ischemia, NSCSLT   Labs on Admission: I have personally reviewed the available labs and imaging studies at the time of the admission.  Pertinent labs:    Unremarkable BMP WBC 5.4 Hgb 11.4 COVID/flu/RSV negative HS troponin 172, 462, 643, 585   Assessment and Plan: Principal  Problem:   Exacerbation of asthma Active Problems:   Moderate persistent asthma   Troponin level elevated   Elevated blood pressure reading    Asthma exacerbation He has history of moderate persistent asthma and presented with SOB, not improved with home MDI No obvious trigger COVID/flu/RSV negative Not frankly ill-appearing and not hypoxic despite significant SOB Also without extensive wheezing on exam Improved after neb treatment and IV Solumedrol in the ER Will observe patient overnight  Nebulizers: prn albuterol  and standing Duonebs Solu-Medrol  80 mg IV BID  No antibiotics at this time  Will also give magnesium  bolus Continue home Dulera    Elevated troponin Concern for ACS EKG is unremarkable Troponin uptrended but appears to have peaked Atypical symptoms for ACS He was started on Heparin  Cardiology is consulting Awaiting coronary artery CTA and Echo If negative above studies, can dc heparin  Risk stratification with A1c, lipid panel, and TSH  Elevated BP Denies h/o HTN Persistently hypertensive in the ER He may benefit from outpatient medication Will cover for now with prn IV hydralazine  (he was also given a dose of lopressor  to lower HR for CTA)    Advance Care Planning:   Code Status: Full Code  - Code status was discussed with the patient and/or family  at the time of admission.  The patient would want to receive full resuscitative measures at this time.   Consults: Cardiology  DVT Prophylaxis: Heparin  drip  Family Communication: Wife was present throughout evaluation  Severity of Illness: The appropriate patient status for this patient is OBSERVATION. Observation status is judged to be reasonable and necessary in order to provide the required intensity of service to ensure the patient's safety. The patient's presenting symptoms, physical exam findings, and initial radiographic and laboratory data in the context of their medical condition is felt to place them  at decreased risk for further clinical deterioration. Furthermore, it is anticipated that the patient will be medically stable for discharge from the hospital within 2 midnights of admission.   Author: Delon Herald, MD 08/28/2023 11:57 AM  For on call review [email or web address].

## 2023-08-28 NOTE — ED Notes (Addendum)
 Pt endorses some concerns about his breathing and is requesting additional breathing treatments. RT notified and pt placed on 2L [place], EDP ([place]) made aware.

## 2023-08-28 NOTE — ED Notes (Signed)
 ED TO INPATIENT HANDOFF REPORT  ED Nurse Name and Phone #: Thomas [name] Name/Age/Gender Thomas [name]. 63 y.o. male Room/Bed: 046C/046C  Code Status   Code Status: Full Code  Home/SNF/Other Home Patient oriented to: self, place, time, and situation Is this baseline? Yes   Triage Complete: Triage complete  Chief Complaint ACS (acute coronary syndrome) (HCC) [I24.9] Shortness of breath [R06.02]  Triage Note Pt arrives with c/o SOB that started this morning. Pt endorses cough and chest tightness. Pt denies being sick recently. Pt has hx of asthma. Pt used home inhalers without relief.    Allergies No Known Allergies  Level of Care/Admitting Diagnosis ED Disposition     ED Disposition  Admit   Condition  --   Comment  Hospital Area: [facility] [[id number]]  Level of Care: Telemetry Cardiac [[id number]]  May place patient in observation at [facility] or [facility] if equivalent level of care is available:: No  Covid Evaluation: Confirmed COVID Negative  Diagnosis: Shortness of breath [786.05.ICD-9-CM]  Admitting Physician: BARBARANN Thomas [[id number]]  Attending Physician: BARBARANN Thomas [[id number]]          B Medical/Surgery History Past Medical History:  Diagnosis Date   Asthma    Past Surgical History:  Procedure Laterality Date   KNEE ARTHROSCOPY Right    2015   NO PAST SURGERIES       A IV Location/Drains/Wounds Patient Lines/Drains/Airways Status     Active Line/Drains/Airways     Name Placement date Placement time Site Days   Peripheral IV 08/28/23 20 G Left Antecubital 08/28/23  0538  Antecubital  less than 1   Peripheral IV 08/28/23 20 G Anterior;Left Forearm 08/28/23  0927  Forearm  less than 1   Peripheral IV 08/28/23 18 G 1.88 Right Antecubital 08/28/23  1023  Antecubital  less than 1            Intake/Output Last 24 hours  Intake/Output Summary (Last 24 hours) at 08/28/2023 1434 Last data filed at 08/28/2023 0602 Gross  per 24 hour  Intake 240 ml  Output 1300 ml  Net -1060 ml    Labs/Imaging Results for orders placed or performed during the hospital encounter of 08/27/23 (from the past 48 hours)  Basic metabolic panel     Status: Abnormal   Collection Time: 08/27/23  8:42 PM  Result Value Ref Range   Sodium 133 (L) 135 - 145 mmol/L   Potassium 3.5 3.5 - 5.1 mmol/L   Chloride 101 98 - 111 mmol/L   CO2 24 22 - 32 mmol/L   Glucose, Bld 102 (H) 70 - 99 mg/dL    Comment: Glucose reference range applies only to samples taken after fasting for at least 8 hours.   BUN 10 8 - 23 mg/dL   Creatinine, Ser 8.87 0.61 - 1.24 mg/dL   Calcium 8.4 (L) 8.9 - 10.3 mg/dL   GFR, Estimated >39 >39 mL/min    Comment: (NOTE) Calculated using the CKD-EPI Creatinine Equation (2021)    Anion gap 8 5 - 15    Comment: Performed at [facility], [address] [place], [place] [address]  CBC     Status: Abnormal   Collection Time: 08/27/23  8:42 PM  Result Value Ref Range   WBC 5.4 4.0 - 10.5 K/uL   RBC 3.48 (L) 4.22 - 5.81 MIL/uL   Hemoglobin 11.4 (L) 13.0 - 17.0 g/dL   HCT 65.6 (L) 60.9 - 47.9 %  MCV 98.6 80.0 - 100.0 fL   MCH 32.8 26.0 - 34.0 pg   MCHC 33.2 30.0 - 36.0 g/dL   RDW 86.7 88.4 - 84.4 %   Platelets 203 150 - 400 K/uL   nRBC 0.0 0.0 - 0.2 %    Comment: Performed at [facility], [address] [place], [place] [address]  Troponin I (High Sensitivity)     Status: Abnormal   Collection Time: 08/27/23  8:46 PM  Result Value Ref Range   Troponin I (High Sensitivity) 172 (HH) <18 ng/L    Comment: CRITICAL RESULT CALLED TO, READ BACK BY AND VERIFIED WITH ALFRED DILLARD RN AT 2132 ON 08/27/23 BY [email or web address] (NOTE) Elevated high sensitivity troponin I (hsTnI) values and significant  changes across serial measurements may suggest ACS but many other  chronic and acute conditions are known to elevate hsTnI results.  Refer to the Links section for chest pain algorithms and additional   guidance. Performed at [facility], [address] [place], [place] [address]   Resp panel by RT-PCR (RSV, Flu A&B, Covid) Anterior Nasal Swab     Status: None   Collection Time: 08/27/23  9:19 PM   Specimen: Anterior Nasal Swab  Result Value Ref Range   SARS Coronavirus 2 by RT PCR NEGATIVE NEGATIVE    Comment: (NOTE) SARS-CoV-2 target nucleic acids are NOT DETECTED.  The SARS-CoV-2 RNA is generally detectable in upper respiratory specimens during the acute phase of infection. The lowest concentration of SARS-CoV-2 viral copies this assay can detect is 138 copies/mL. A negative result does not preclude SARS-Cov-2 infection and should not be used as the sole basis for treatment or other patient management decisions. A negative result may occur with  improper specimen collection/handling, submission of specimen other than nasopharyngeal swab, presence of viral mutation(s) within the areas targeted by this assay, and inadequate number of viral copies(<138 copies/mL). A negative result must be combined with clinical observations, patient history, and epidemiological information. The expected result is Negative.  Fact Sheet for Patients:  bloggercourse.com  Fact Sheet for Healthcare Providers:  seriousbroker.it  This test is no t yet approved or cleared by the [place]  FDA and  has been authorized for detection and/or diagnosis of SARS-CoV-2 by FDA under an Emergency Use Authorization (EUA). This EUA will remain  in effect (meaning this test can be used) for the duration of the COVID-19 declaration under Section 564(b)(1) of the Act, 21 U.S.C.section 360bbb-3(b)(1), unless the authorization is terminated  or revoked sooner.       Influenza A by PCR NEGATIVE NEGATIVE   Influenza B by PCR NEGATIVE NEGATIVE    Comment: (NOTE) The Xpert Xpress SARS-CoV-2/FLU/RSV plus assay is intended as an aid in the diagnosis  of influenza from Nasopharyngeal swab specimens and should not be used as a sole basis for treatment. Nasal washings and aspirates are unacceptable for Xpert Xpress SARS-CoV-2/FLU/RSV testing.  Fact Sheet for Patients: bloggercourse.com  Fact Sheet for Healthcare Providers: seriousbroker.it  This test is not yet approved or cleared by the [place]  FDA and has been authorized for detection and/or diagnosis of SARS-CoV-2 by FDA under an Emergency Use Authorization (EUA). This EUA will remain in effect (meaning this test can be used) for the duration of the COVID-19 declaration under Section 564(b)(1) of the Act, 21 U.S.C. section 360bbb-3(b)(1), unless the authorization is terminated or revoked.     Resp Syncytial Virus by PCR NEGATIVE NEGATIVE  Comment: (NOTE) Fact Sheet for Patients: bloggercourse.com  Fact Sheet for Healthcare Providers: seriousbroker.it  This test is not yet approved or cleared by the [place]  FDA and has been authorized for detection and/or diagnosis of SARS-CoV-2 by FDA under an Emergency Use Authorization (EUA). This EUA will remain in effect (meaning this test can be used) for the duration of the COVID-19 declaration under Section 564(b)(1) of the Act, 21 U.S.C. section 360bbb-3(b)(1), unless the authorization is terminated or revoked.  Performed at [facility], [address] [place], [place] [address]   Troponin I (High Sensitivity)     Status: Abnormal   Collection Time: 08/27/23 11:03 PM  Result Value Ref Range   Troponin I (High Sensitivity) 462 (HH) <18 ng/L    Comment: CRITICAL RESULT CALLED TO, READ BACK BY AND VERIFIED WITH A. DILLARD,CHARGE RN 2335 08/27/2023 T. TYSOR (NOTE) Elevated high sensitivity troponin I (hsTnI) values and significant  changes across serial measurements may suggest ACS but many other  chronic  and acute conditions are known to elevate hsTnI results.  Refer to the Links section for chest pain algorithms and additional  guidance. Performed at [facility] [address] [place], [place] [address]   Troponin I (High Sensitivity)     Status: Abnormal   Collection Time: 08/28/23  4:36 AM  Result Value Ref Range   Troponin I (High Sensitivity) 643 (HH) <18 ng/L    Comment: CRITICAL RESULT CALLED TO, READ BACK BY AND VERIFIED WITH A. DILLARD,CHARGE RN 9492 08/28/2023 T. TYSOR (NOTE) Elevated high sensitivity troponin I (hsTnI) values and significant  changes across serial measurements may suggest ACS but many other  chronic and acute conditions are known to elevate hsTnI results.  Refer to the Links section for chest pain algorithms and additional  guidance. Performed at [facility], [address] [place], [place] [address]   Troponin I (High Sensitivity)     Status: Abnormal   Collection Time: 08/28/23  6:55 AM  Result Value Ref Range   Troponin I (High Sensitivity) 585 (HH) <18 ng/L    Comment: CRITICAL RESULT CALLED TO, READ BACK BY AND VERIFIED WITH K. Moon RN , @0756 , 08/28/23, [place], T. (NOTE) Elevated high sensitivity troponin I (hsTnI) values and significant  changes across serial measurements may suggest ACS but many other  chronic and acute conditions are known to elevate hsTnI results.  Refer to the Links section for chest pain algorithms and additional  guidance. Performed at [facility], [address], [place], [place] [address]   Heparin  level (unfractionated)     Status: None   Collection Time: 08/28/23  8:00 AM  Result Value Ref Range   Heparin  Unfractionated 0.51 0.30 - 0.70 IU/mL    Comment: (NOTE) The clinical reportable range upper limit is being lowered to >1.10 to align with the FDA approved guidance for the current laboratory assay.  If heparin  results are below expected values, and patient dosage has  been  confirmed, suggest follow up testing of antithrombin III levels. Performed at [facility], [address], [place], [place] [address]   C-reactive protein     Status: None   Collection Time: 08/28/23  8:10 AM  Result Value Ref Range   CRP 0.6 <1.0 mg/dL    Comment: Performed at [facility], [address], [place], [place] [address]  Lipid panel     Status: None  Collection Time: 08/28/23  8:10 AM  Result Value Ref Range   Cholesterol 140 0 - 200 mg/dL   Triglycerides 35 <849 mg/dL   HDL 53 >59 mg/dL   Total CHOL/HDL Ratio 2.6 RATIO   VLDL 7 0 - 40 mg/dL   LDL Cholesterol 80 0 - 99 mg/dL    Comment:        Total Cholesterol/HDL:CHD Risk Coronary Heart Disease Risk Table                     Men   Women  1/2 Average Risk   3.4   3.3  Average Risk       5.0   4.4  2 X Average Risk   9.6   7.1  3 X Average Risk  23.4   11.0        Use the calculated Patient Ratio above and the CHD Risk Table to determine the patient's CHD Risk.        ATP III CLASSIFICATION (LDL):  <100     mg/dL   Optimal  899-870  mg/dL   Near or Above                    Optimal  130-159  mg/dL   Borderline  839-810  mg/dL   High  >809     mg/dL   Very High Performed at [facility], [address] [place], [place] [address]   TSH     Status: Abnormal   Collection Time: 08/28/23  8:10 AM  Result Value Ref Range   TSH 0.315 (L) 0.350 - 4.500 uIU/mL    Comment: Performed by a 3rd Generation assay with a functional sensitivity of <=0.01 uIU/mL. Performed at [facility], [address] [place], [place] [address]    CT CORONARY MORPH W/CTA COR W/SCORE DEL W/CM &/OR WO/CM Addendum Date: 08/28/2023 ADDENDUM REPORT: 08/28/2023 12:22 ADDENDUM: There is prior CT angiogram of the chest complete from 08/27/2023. Please correlate with that examination. Otherwise along the visualized lung bases there are areas of bronchial wall thickening along both lower lobes. The visualized portions  of the lungs are without consolidation, pneumothorax or effusion. Mild debris along the right main bronchus. Visualized mediastinum is without lymph node enlargement. A few small less than 1 cm size short axis subcarinal and left hilar nodes are seen, nonpathologic by size criteria. The upper abdomen is barely included in the imaging field. Only the top of the liver and spleen are included and are grossly preserved on this limited exam. Degenerative changes are seen along the spine. Electronically Signed   By: Ranell Bring M.D.   On: 08/28/2023 12:22   Result Date: 08/28/2023 CLINICAL DATA:  Chest pain EXAM: Cardiac CTA MEDICATIONS: Sub lingual nitro. 4mg  x 2 TECHNIQUE: The patient was scanned on a Siemens 192 slice scanner. Gantry rotation speed was 250 msecs. Collimation was 0.6 mm. A 100 kV prospective scan was triggered in the ascending thoracic aorta at 35-75% of the R-R interval. Average HR during the scan was 60 bpm. The 3D data set was interpreted on a dedicated work station using MPR, MIP and VRT modes. A total of 80cc of contrast was used. FINDINGS: Non-cardiac: See separate report from [facility]. Pulmonary veins drain normally to the left atrium. No LA appendage thrombus. Calcium Score: 2.74 Agatston units. Coronary Arteries: Right dominant with no anomalies LM: No plaque or stenosis. LAD system:  No plaque  or stenosis. Circumflex system: Calcified plaque ostial LCx, mild (1-24%) stenosis. RCA system: No plaque or stenosis. IMPRESSION: 1. Coronary artery calcium score 2.74 Agatston units. This places the patient in the 2nd percentile for age and gender, suggesting low risk for future cardiac events. 2.  Minimal coronary disease. [name] Electronically Signed: By: Ezra Shuck M.D. On: 08/28/2023 12:06   ECHOCARDIOGRAM COMPLETE Result Date: 08/28/2023    ECHOCARDIOGRAM REPORT   Patient Name:   Thomas Gieselman [name]. Date of Exam: 08/28/2023 Medical Rec #:  [id number]              Height:       68.0 in Accession #:    [id number]            Weight:       164.9 lb Date of Birth:  04/21/1961             BSA:          1.883 m Patient Age:    62 years              BP:           135/82 mmHg Patient Gender: M                     HR:           65 bpm. Exam Location:  Inpatient Procedure: 2D Echo, Cardiac Doppler, Color Doppler and Intracardiac            Opacification Agent Indications:    R07.9* Chest pain, unspecified  History:        Patient has no prior history of Echocardiogram examinations.                 NSTEMI; Signs/Symptoms:Shortness of Breath and Chest Pain.  Sonographer:    Lanell Maduro Referring Phys: [id number] ARTIST POUCH IMPRESSIONS  1. Left ventricular ejection fraction, by estimation, is 40 to 45%. The left ventricle has mildly decreased function. The left ventricle demonstrates regional wall motion abnormalities (see scoring diagram/findings for description). Left ventricular diastolic parameters are consistent with Grade I diastolic dysfunction (impaired relaxation).  2. Right ventricular systolic function is normal. The right ventricular size is normal.  3. The mitral valve is normal in structure. No evidence of mitral valve regurgitation. No evidence of mitral stenosis.  4. The aortic valve is tricuspid. There is mild calcification of the aortic valve. Aortic valve regurgitation is not visualized. No aortic stenosis is present.  5. The inferior vena cava is normal in size with greater than 50% respiratory variability, suggesting right atrial pressure of 3 mmHg. FINDINGS  Left Ventricle: Left ventricular ejection fraction, by estimation, is 40 to 45%. The left ventricle has mildly decreased function. The left ventricle demonstrates regional wall motion abnormalities. The left ventricular internal cavity size was normal in size. There is no left ventricular hypertrophy. Left ventricular diastolic parameters are consistent with Grade I diastolic dysfunction (impaired  relaxation).  LV Wall Scoring: The mid and distal anterior septum, mid inferoseptal segment, and apical inferior segment are hypokinetic. Right Ventricle: The right ventricular size is normal. No increase in right ventricular wall thickness. Right ventricular systolic function is normal. Left Atrium: Left atrial size was normal in size. Right Atrium: Right atrial size was normal in size. Pericardium: There is no evidence of pericardial effusion. Mitral Valve: The mitral valve is normal in structure. No evidence of mitral valve regurgitation. No evidence of mitral valve stenosis.  Tricuspid Valve: The tricuspid valve is normal in structure. Tricuspid valve regurgitation is trivial. No evidence of tricuspid stenosis. Aortic Valve: The aortic valve is tricuspid. There is mild calcification of the aortic valve. Aortic valve regurgitation is not visualized. No aortic stenosis is present. Pulmonic Valve: The pulmonic valve was normal in structure. Pulmonic valve regurgitation is not visualized. No evidence of pulmonic stenosis. Aorta: The aortic root is normal in size and structure. Venous: The inferior vena cava is normal in size with greater than 50% respiratory variability, suggesting right atrial pressure of 3 mmHg. IAS/Shunts: No atrial level shunt detected by color flow Doppler.  LEFT VENTRICLE PLAX 2D LVIDd:         3.80 cm      Diastology LVIDs:         2.10 cm      LV e' medial:    6.37 cm/s LV PW:         0.90 cm      LV E/e' medial:  9.4 LV IVS:        0.90 cm      LV e' lateral:   9.64 cm/s LVOT diam:     2.10 cm      LV E/e' lateral: 6.2 LV SV:         46 LV SV Index:   24 LVOT Area:     3.46 cm  LV Volumes (MOD) LV vol d, MOD A2C: 100.0 ml LV vol d, MOD A4C: 91.8 ml LV vol s, MOD A2C: 47.7 ml LV vol s, MOD A4C: 51.5 ml LV SV MOD A2C:     52.3 ml LV SV MOD A4C:     91.8 ml LV SV MOD BP:      45.4 ml RIGHT VENTRICLE            IVC RV Basal diam:  3.00 cm    IVC diam: 1.90 cm RV S prime:     8.86 cm/s TAPSE  (M-mode): 2.3 cm LEFT ATRIUM           Index       RIGHT ATRIUM          Index LA diam:      2.90 cm 1.54 cm/m  RA Area:     9.05 cm LA Vol (A2C): 16.9 ml 8.98 ml/m  RA Volume:   16.70 ml 8.87 ml/m LA Vol (A4C): 16.6 ml 8.82 ml/m  AORTIC VALVE LVOT Vmax:   72.50 cm/s LVOT Vmean:  43.700 cm/s LVOT VTI:    0.133 m  AORTA Ao Root diam: 2.80 cm MITRAL VALVE               TRICUSPID VALVE MV Area (PHT): 2.87 cm    TR Peak grad:   24.2 mmHg MV Decel Time: 264 msec    TR Vmax:        246.00 cm/s MV E velocity: 59.60 cm/s MV A velocity: 60.70 cm/s  SHUNTS MV E/A ratio:  0.98        Systemic VTI:  0.13 m                            Systemic Diam: 2.10 cm Toribio Fuel MD Electronically signed by Toribio Fuel MD Signature Date/Time: 08/28/2023/12:16:15 PM    Final    CT Angio Chest PE W and/or Wo Contrast Result Date: 08/27/2023 CLINICAL DATA:  Shortness of breath and cough, chest tightness EXAM:  CT ANGIOGRAPHY CHEST WITH CONTRAST TECHNIQUE: Multidetector CT imaging of the chest was performed using the standard protocol during bolus administration of intravenous contrast. Multiplanar CT image reconstructions and MIPs were obtained to evaluate the vascular anatomy. RADIATION DOSE REDUCTION: This exam was performed according to the departmental dose-optimization program which includes automated exposure control, adjustment of the mA and/or kV according to patient size and/or use of iterative reconstruction technique. CONTRAST:  75mL OMNIPAQUE  IOHEXOL  350 MG/ML SOLN COMPARISON:  Radiographs earlier today and CTA chest 04/23/2021 FINDINGS: Cardiovascular: Negative for acute pulmonary embolism. Normal caliber thoracic aorta. No pericardial effusion. Mediastinum/Nodes: Trachea and esophagus are unremarkable. No thoracic adenopathy Lungs/Pleura: No focal consolidation, pleural effusion, or pneumothorax. Similar mild bronchial wall thickening compared to 04/23/2021. Upper Abdomen: No acute abnormality. Musculoskeletal: No  acute fracture. Review of the MIP images confirms the above findings. IMPRESSION: Negative for acute pulmonary embolism. Mild bronchitis/reactive airways. Electronically Signed   By: Norman Gatlin M.D.   On: 08/27/2023 22:28   DG Chest 2 View Result Date: 08/27/2023 CLINICAL DATA:  Chest pain and shortness of breath EXAM: CHEST - 2 VIEW COMPARISON:  05/07/2022 FINDINGS: The heart size and mediastinal contours are within normal limits. Both lungs are clear. The visualized skeletal structures are unremarkable. IMPRESSION: No active cardiopulmonary disease. Electronically Signed   By: Oneil Devonshire M.D.   On: 08/27/2023 20:57    Pending Labs Unresulted Labs (From admission, onward)     Start     Ordered   08/28/23 1700  Heparin  level (unfractionated)  Once-Timed,   TIMED        08/28/23 0912   08/28/23 0955  Sedimentation rate  Once,   R        08/28/23 0955   08/28/23 0833  Hemoglobin A1c  Once,   R        08/28/23 0833   08/28/23 0832  HIV Antibody (routine testing w rflx)  (HIV Antibody (Routine testing w reflex) panel)  Once,   R        08/28/23 0833            Vitals/Pain Today's Vitals   08/28/23 0700 08/28/23 0734 08/28/23 1130 08/28/23 1235  BP: (!) 167/93 (!) 142/84 122/88 (!) 158/92  Pulse: 94 87 78 70  Resp: (!) 23 20 15 20   Temp:  98.2 F (36.8 C)  (!) 97.4 F (36.3 C)  TempSrc:  Oral  Oral  SpO2: 100% 100% 99% 100%  Weight:      PainSc:  0-No pain      Isolation Precautions No active isolations  Medications Medications  heparin  ADULT infusion 100 units/mL (25000 units/250mL) (900 Units/hr Intravenous New Bag/Given 08/27/23 2346)  sodium chloride  flush (NS) 0.9 % injection 3 mL (3 mLs Intravenous Not Given 08/28/23 0903)  acetaminophen  (TYLENOL ) tablet 650 mg (has no administration in time range)    Or  acetaminophen  (TYLENOL ) suppository 650 mg (has no administration in time range)  oxyCODONE  (Oxy IR/ROXICODONE ) immediate release tablet 5 mg (has no  administration in time range)  docusate sodium  (COLACE) capsule 100 mg (100 mg Oral Not Given 08/28/23 1005)  polyethylene glycol (MIRALAX  / GLYCOLAX ) packet 17 g (has no administration in time range)  bisacodyl  (DULCOLAX) EC tablet 5 mg (has no administration in time range)  ondansetron  (ZOFRAN ) tablet 4 mg (has no administration in time range)    Or  ondansetron  (ZOFRAN ) injection 4 mg (has no administration in time range)  albuterol  (PROVENTIL ) (2.5 MG/3ML) 0.083% nebulizer  solution 2.5 mg (2.5 mg Nebulization Given 08/28/23 0847)  hydrALAZINE  (APRESOLINE ) injection 5 mg (has no administration in time range)  morphine  (PF) 2 MG/ML injection 2 mg (has no administration in time range)  ipratropium-albuterol  (DUONEB) 0.5-2.5 (3) MG/3ML nebulizer solution 3 mL (3 mLs Nebulization Given 08/28/23 1318)  methylPREDNISolone  sodium succinate (SOLU-MEDROL ) 125 mg/2 mL injection 80 mg (has no administration in time range)  mometasone -formoterol  (DULERA ) 100-5 MCG/ACT inhaler 2 puff (has no administration in time range)  perflutren  lipid microspheres (DEFINITY ) IV suspension (1 mL Intravenous Given 08/28/23 1210)  levalbuterol  (XOPENEX ) nebulizer solution 0.63 mg (0.63 mg Nebulization Given 08/27/23 2153)  ipratropium (ATROVENT ) nebulizer solution 0.5 mg (0.5 mg Nebulization Given 08/27/23 2153)  iohexol  (OMNIPAQUE ) 350 MG/ML injection 75 mL (75 mLs Intravenous Contrast Given 08/27/23 2216)  heparin  bolus via infusion 4,000 Units (4,000 Units Intravenous Bolus from Bag 08/27/23 2346)  aspirin  chewable tablet 324 mg (324 mg Oral Given 08/28/23 0028)  ipratropium (ATROVENT ) nebulizer solution 0.5 mg (0.5 mg Nebulization Given by Other 08/28/23 0540)  levalbuterol  (XOPENEX ) nebulizer solution 0.63 mg (0.63 mg Nebulization Given by Other 08/28/23 0540)  metoprolol  tartrate (LOPRESSOR ) tablet 100 mg (100 mg Oral Given 08/28/23 0846)  methylPREDNISolone  sodium succinate (SOLU-MEDROL ) 125 mg/2 mL injection 125 mg (125 mg  Intravenous Given 08/28/23 0847)  nitroGLYCERIN  (NITROSTAT ) SL tablet 0.8 mg (0.8 mg Sublingual Given 08/28/23 1114)  iohexol  (OMNIPAQUE ) 350 MG/ML injection 95 mL (95 mLs Intravenous Contrast Given 08/28/23 1130)  magnesium  sulfate IVPB 2 g 50 mL (0 g Intravenous Stopped 08/28/23 1350)    Mobility walks     Focused Assessments Cardiac Assessment Handoff:  Cardiac Rhythm: Normal sinus rhythm No results found for: CKTOTAL, CKMB, CKMBINDEX, TROPONINI Lab Results  Component Value Date   DDIMER 9.18 (H) 05/05/2022   Does the Patient currently have chest pain? No    R Recommendations: See Admitting Provider Note  Report given to:   Additional Notes:

## 2023-08-28 NOTE — Consult Note (Addendum)
 Cardiology Consultation   [name] ID: Thomas Lamorte Sr. MRN: [id number]; DOB: September 27, 1960  Admit date: 08/27/2023 Date of Consult: 08/28/2023  PCP:  Thomas Heron HERO, MD   [facility] Providers Cardiologist:  None        [name] Profile:   Thomas [name]. is a 63 y.o. male with a hx of asthma who is being seen 08/28/2023 for Thomas evaluation of chest pain, increased cough at Thomas request of Dr. Armenta at [facility].  History of Present Illness:   Thomas [name] presented to [facility] on Thomas evening of 1/9 for evaluation of shortness of breath that began earlier in Thomas day.  [name] has a history of asthma and reports that during Thomas day he developed increased cough with associated chest tightness.  [name] increased home inhaler use but without significant relief of symptoms.  [name] reports that his chest tightness and discomfort occurs exclusively with deep breaths and coughing.  He otherwise denies chest pain both at rest and at exertion.  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  He denies history of smoking, substance use, alcohol use, diabetes, hypertension.  [name] reports that he thought he maybe had an upper respiratory infection prior to his presentation to Thomas emergency department.  While at [facility], [name] underwent CTA chest that was negative for pulmonary embolism.  Mild bronchitis/reactive airway changes noted.  Labs notable for hemoglobin of 11.4 ([name] appears to be chronically anemic), troponin found to be elevated at 172, trended to 462, 643.  Given rising troponins, [name] started on heparin  for possible ACS and was transferred to [facility] emergency department.   Past Medical History:  Diagnosis Date   Asthma     Past Surgical History:  Procedure Laterality Date   KNEE ARTHROSCOPY Right    2015   NO PAST SURGERIES       Home Medications:  Prior to Admission  medications   Medication Sig Start Date End Date Taking? Authorizing Provider  albuterol  (PROVENTIL  HFA) 108 (90 Base) MCG/ACT inhaler Inhale 2 puffs into Thomas lungs every 4 (four) hours as needed for wheezing or shortness of breath. 10/14/22   Thomas Heron HERO, MD  Levalbuterol  Tartrate (XOPENEX  HFA IN) Inhale into Thomas lungs 2 (two) times daily.    [provider]  mometasone -formoterol  (DULERA ) 100-5 MCG/ACT AERO Inhale 2 puffs into Thomas lungs 2 (two) times daily.    [provider]  predniSONE  (DELTASONE ) 20 MG tablet 2 tabs po daily x 4 days 05/12/23   Floyd, Dan, DO  SYMBICORT 80-4.5 MCG/ACT inhaler Inhale 2 puffs into Thomas lungs 2 (two) times daily. 04/30/22   [provider]    Inpatient Medications: Scheduled Meds:  Continuous Infusions:  heparin  900 Units/hr (08/27/23 2346)   PRN Meds:   Allergies:   No Known Allergies  Social History:   Social History   Socioeconomic History   Marital status: Married    Spouse name: Not on file   Number of children: Not on file   Years of education: Not on file   Highest education level: Not on file  Occupational History   Occupation: truck driver  Tobacco Use   Smoking status: Never   Smokeless tobacco: Never  Vaping Use   Vaping status: Never Used  Substance and Sexual Activity   Alcohol use: No   Drug use: No   Sexual activity: Yes  Other Topics  Concern   Not on file  Social History Narrative   Not on file   Social Drivers of Health   Financial Resource Strain: Not on file  Food Insecurity: Not on file  Transportation Needs: Not on file  Physical Activity: Not on file  Stress: Not on file  Social Connections: Unknown (12/27/2021)   Received from [facility]   Social Network    Social Network: Not on file  Intimate Partner Violence: Unknown (11/18/2021)   Received from [facility], [facility]   HITS    Physically Hurt: Not on file    Insult or Talk Down To: Not on file     Threaten Physical Harm: Not on file    Scream or Curse: Not on file    Family History:   History reviewed. No pertinent family history.   ROS:  Please see Thomas history of present illness.   All other ROS reviewed and negative.     Physical Exam/Data:   Vitals:   08/28/23 0556 08/28/23 0600 08/28/23 0649 08/28/23 0700  BP:  (!) 146/90 (!) 157/95 (!) 167/93  Pulse: 89 92 88 94  Resp: 18 17 (!) 23 (!) 23  Temp:   97.8 F (36.6 C)   TempSrc:   Oral   SpO2: 98% 97% 100% 100%  Weight:        Intake/Output Summary (Last 24 hours) at 08/28/2023 0724 Last data filed at 08/28/2023 0602 Gross per 24 hour  Intake 240 ml  Output 1300 ml  Net -1060 ml      08/28/2023    3:55 AM 05/12/2023    1:27 PM 10/13/2022    3:15 PM  Last 3 Weights  Weight (lbs) 164 lb 14.5 oz 165 lb 168 lb 3.2 oz  Weight (kg) 74.8 kg 74.844 kg 76.295 kg     Body mass index is 25.07 kg/m.  General:  Well nourished, well developed, in no acute distress. Frequent dry cough on exam HEENT: normal Neck: no JVD Vascular: No carotid bruits; Distal pulses 2+ bilaterally Cardiac:  normal S1, S2; RRR; no murmur  Lungs:  faint bilateral upper lobe inspiratory wheezing Abd: soft, nontender, no hepatomegaly  Ext: no edema Musculoskeletal:  No deformities, BUE and BLE strength normal and equal Skin: warm and dry  Neuro:  CNs 2-12 intact, no focal abnormalities noted Psych:  Normal affect   EKG:  Thomas EKG was personally reviewed and demonstrates:  sinus rhythm with hyperdynamic T waves Telemetry:  Telemetry was personally reviewed and demonstrates:  sinus rhythm  Relevant CV Studies:  No prior studies   Laboratory Data:  High Sensitivity Troponin:   Recent Labs  Lab 08/27/23 2046 08/27/23 2303 08/28/23 0436  TROPONINIHS 172* 462* 643*     Chemistry Recent Labs  Lab 08/27/23 2042  NA 133*  K 3.5  CL 101  CO2 24  GLUCOSE 102*  BUN 10  CREATININE 1.12  CALCIUM 8.4*  GFRNONAA >60  ANIONGAP 8     No results for input(s): PROT, ALBUMIN, AST, ALT, ALKPHOS, BILITOT in Thomas last 168 hours. Lipids No results for input(s): CHOL, TRIG, HDL, LABVLDL, LDLCALC, CHOLHDL in Thomas last 168 hours.  Hematology Recent Labs  Lab 08/27/23 2042  WBC 5.4  RBC 3.48*  HGB 11.4*  HCT 34.3*  MCV 98.6  MCH 32.8  MCHC 33.2  RDW 13.2  PLT 203   Thyroid No results for input(s): TSH, FREET4 in Thomas last 168 hours.  BNPNo results for  input(s): BNP, PROBNP in Thomas last 168 hours.  DDimer No results for input(s): DDIMER in Thomas last 168 hours.   Radiology/Studies:  CT Angio Chest PE W and/or Wo Contrast Result Date: 08/27/2023 CLINICAL DATA:  Shortness of breath and cough, chest tightness EXAM: CT ANGIOGRAPHY CHEST WITH CONTRAST TECHNIQUE: Multidetector CT imaging of Thomas chest was performed using Thomas standard protocol during bolus administration of intravenous contrast. Multiplanar CT image reconstructions and MIPs were obtained to evaluate Thomas vascular anatomy. RADIATION DOSE REDUCTION: This exam was performed according to Thomas departmental dose-optimization program which includes automated exposure control, adjustment of the mA and/or kV according to [name] size and/or use of iterative reconstruction technique. CONTRAST:  75mL OMNIPAQUE  IOHEXOL  350 MG/ML SOLN COMPARISON:  Radiographs earlier today and CTA chest 04/23/2021 FINDINGS: Cardiovascular: Negative for acute pulmonary embolism. Normal caliber thoracic aorta. No pericardial effusion. Mediastinum/Nodes: Trachea and esophagus are unremarkable. No thoracic adenopathy Lungs/Pleura: No focal consolidation, pleural effusion, or pneumothorax. Similar mild bronchial wall thickening compared to 04/23/2021. Upper Abdomen: No acute abnormality. Musculoskeletal: No acute fracture. Review of Thomas MIP images confirms Thomas above findings. IMPRESSION: Negative for acute pulmonary embolism. Mild bronchitis/reactive airways. Electronically Signed    By: Norman Gatlin M.D.   On: 08/27/2023 22:28   DG Chest 2 View Result Date: 08/27/2023 CLINICAL DATA:  Chest pain and shortness of breath EXAM: CHEST - 2 VIEW COMPARISON:  05/07/2022 FINDINGS: Thomas heart size and mediastinal contours are within normal limits. Both lungs are clear. Thomas visualized skeletal structures are unremarkable. IMPRESSION: No active cardiopulmonary disease. Electronically Signed   By: Oneil Devonshire M.D.   On: 08/27/2023 20:57     Assessment and Plan:   NSTEMI-likely type II demand ischemia Atypical chest pain  [name] with hx asthma presented to [facility] for evaluation of cough associated with chest tightness and some shortness of breath. [name] had onset of symptoms rather suddenly on 1/9, initially thought it might be asthma exacerbation or URI. ECG without acute ischemic changes. Troponin 172->462->643->585.  Although troponin elevation is noted, symptoms are very atypical for ACS. Per ED notes, [name] with bilateral wheezing on his initial presentation. Will consult on [name] and request admission/assistance from [facility] for evaluation non-cardiac causes. Plan to assess coronary arteries with cardiac CTA after discussion with Dr. Michele.  Continue heparin  pending echo and CCTA. If both reassuring, would stop.  Will also check ESR and CRP for possible pericarditis.   Hyperlipidemia  [name] with elevated LDL as of February 2024. Repeat lipid panel ordered today.   Lab Results  Component Value Date   CHOL 190 10/13/2022   HDL 58.90 10/13/2022   LDLCALC 120 (H) 10/13/2022   TRIG 51.0 10/13/2022   CHOLHDL 3 10/13/2022   Asthma  Management per primary team.   Risk Assessment/Risk Scores:     For questions or updates, please contact [facility] Please consult [email or web address] for contact info under    Signed, Artist Pouch, PA-C  08/28/2023 7:24 AM  ADDENDUM:   [name] seen and examined with Artist Fallow.  I personally taken a history, examined Thomas  [name], reviewed relevant notes,  laboratory data / imaging studies.  I performed a substantive portion of this encounter and formulated Thomas important aspects of Thomas plan.  I agree with Thomas APP's note, impression, and recommendations; however, I have edited Thomas note to reflect changes or salient points.   [name] was transferred to [facility] emergency room department due to concerns for NSTEMI. Who presented to med Center  [place] with a chief complaint of cough and chest pain. She he is accompanied by his wife at bedside. Cough started yesterday, nonproductive, associated with difficulty in breathing.  Chest tightness: More pronounced with coughing spells. Located throughout his anterior precordium. Intensity 8 out of 10. Not brought on by effort related activities, does not resolve with rest. Coughing makes it worse. Has worsening of shortness of breath at rest and with effort related activities.  PHYSICAL EXAM: Today's Vitals   08/28/23 0600 08/28/23 0649 08/28/23 0700 08/28/23 0734  BP: (!) 146/90 (!) 157/95 (!) 167/93 (!) 142/84  Pulse: 92 88 94 87  Resp: 17 (!) 23 (!) 23 20  Temp:  97.8 F (36.6 C)  98.2 F (36.8 C)  TempSrc:  Oral  Oral  SpO2: 97% 100% 100% 100%  Weight:      PainSc:    0-No pain   Body mass index is 25.07 kg/m.   Net IO Since Admission: -1,060 mL [08/28/23 0846]  Filed Weights   08/28/23 0355  Weight: 74.8 kg    Physical Exam  Constitutional: No distress.  hemodynamically stable  Neck: No JVD present.  Cardiovascular: Regular rhythm, S1 normal and S2 normal. Tachycardia present. Exam reveals no gallop, no S3 and no S4.  No murmur heard. Pulmonary/Chest: Effort normal. No stridor. He has no rales. He has diffuse wheezes.  Abdominal: Soft. Bowel sounds are normal. He exhibits no distension. There is no abdominal tenderness.  Musculoskeletal:        General: No edema.     Cervical back: Neck supple.  Neurological: He is alert and oriented  to person, place, and time. He has intact cranial nerves (2-12).  Skin: Skin is warm.   EKG: (personally reviewed by me) 08/27/2023 Sinus rhythm, 97 bpm, consider old anteroseptal infarct, left axis.  08/28/2023: Sinus rhythm, 97 bpm, consider old anteroseptal infarct, rare PVC Sinus rhythm, 82 bpm, consider old anteroseptal infarct, without underlying ischemia or injury pattern  Telemetry: (personally reviewed by me) Sinus rhythm/sinus tachycardia   Impression:  NSTEMI Precordial pain Elevated biomarkers Intractable coughing/asthma exacerbation/questionable URI symptoms  Recommendations:  Cardiology was asked to evaluate Thomas [name] due to precordial pain, and rising troponin levels.  [name]'s precordial discomfort is predominately noncardiac and likely associated to his upper URI symptoms/asthma exacerbation.  I suspect that Thomas troponin leak that we are currently seeing is secondary to supply demand ischemia given his URI symptoms, asthma, and uncontrolled hypertension.  Given Thomas rise in troponins and precordial pain not a candidate for nuclear stress test at this time.  He will have to wait until troponins trend down or he is asymptomatic prior to stress test.  Alternative testing includes coronary CTA to evaluate for obstructive disease.  I also recommended and reassured him that his current presentation is not secondary to ACS and workup can be done as outpatient.  However [name] is adamant that he would like to have CAD evaluation prior to discharge.  Echo will be ordered to evaluate for structural heart disease and left ventricular systolic function.  Risks, benefits, alternatives for coronary CTA discussed with him and his wife at today's encounter.  No use of PDE 5 inhibitors in Thomas last 48 hours.  Will give him Lopressor  100 mg x 1 to help optimize his heart rate prior to coronary CTA.  He will need 18-gauge IV as well.  CTA staff to help further coordinate his  care.  Based on Thomas results of Thomas echo and coronary  CTA-overall favorable IV heparin  drip can be stopped.  Of note, no history of intracranial or GI bleed.  Overall felt to be low risk for bleeding.  And therefore this shared decision was to treat him empirically.  Recommend primary team to help address his URI/asthma symptoms as he has expiratory wheezes on physical examination.  Also to further risk stratify him check A1c and fasting lipid profile.  Blood pressure management per primary team.  Dr. Barbarann was present during Thomas second half of Thomas evaluation and plan of care discussed with her as well.  [name] and wife both had a chance to express any questions or concerns which were addressed to their satisfaction.  They verbalized understanding and are thankful.  Further recommendations to follow as Thomas case evolves.   This note was created using a voice recognition software as a result there may be grammatical errors inadvertently enclosed that do not reflect Thomas nature of this encounter. Every attempt is made to correct such errors.   Madonna Large, DO, [facility]  [address] [place], [place] [address] Pager: [phone] Office: [phone] 08/28/2023 8:46 AM   Addendum:  CCTA 08/28/2023 1. Coronary artery calcium score 2.74 Agatston units. This places Thomas [name] in Thomas 2nd percentile for age and gender, suggesting low risk for future cardiac events.   2.  Minimal coronary disease.  Echo 08/28/2023  1. Left ventricular ejection fraction, by estimation, is 40 to 45%. Thomas  left ventricle has mildly decreased function. Thomas left ventricle  demonstrates regional wall motion abnormalities (see scoring  diagram/findings for description). Left ventricular  diastolic parameters are consistent with Grade I diastolic dysfunction  (impaired relaxation).   2. Right ventricular systolic function is normal. Thomas right ventricular  size is normal.   3. Thomas mitral  valve is normal in structure. No evidence of mitral valve  regurgitation. No evidence of mitral stenosis.   4. Thomas aortic valve is tricuspid. There is mild calcification of Thomas  aortic valve. Aortic valve regurgitation is not visualized. No aortic  stenosis is present.   5. Thomas inferior vena cava is normal in size with greater than 50%  respiratory variability, suggesting right atrial pressure of 3 mmHg.   Since evaluating him this morning [name] has undergone ischemic workup including an echocardiogram and coronary CTA.  [name] is noted to have very mild CAC and minimal coronary artery disease.  Echocardiogram notes mildly reduced LVEF and grade 1 diastolic dysfunction.  I suspect that Thomas echocardiographic findings are secondary to nonischemic cardiomyopathy likely originating from hypertensive heart disease and questionable viral etiology.  Would recommend up titration of antihypertensive medications and GDMT.  No additional cardiovascular testing is warranted at this time.  I reached out to attending physician and conveyed my recommendations.  Will sign off for now please reach out to cardiology if needed.  Will arrange outpatient follow-up.  Madonna Large, DO, [facility]   [facility]  [address] [place], [place] [address] 4:47 PM 08/28/23

## 2023-08-28 NOTE — ED Notes (Signed)
 Per Dr. Judd Lien, pt Tx to [facility] ED, Dr. Blinda Leatherwood accepting. [facility] called.

## 2023-08-28 NOTE — ED Notes (Signed)
 RE-Paged for Cardiology Consult for ED Doctor

## 2023-08-29 DIAGNOSIS — I5021 Acute systolic (congestive) heart failure: Secondary | ICD-10-CM

## 2023-08-29 LAB — BASIC METABOLIC PANEL
Anion gap: 12 (ref 5–15)
BUN: 15 mg/dL (ref 8–23)
CO2: 20 mmol/L — ABNORMAL LOW (ref 22–32)
Calcium: 9 mg/dL (ref 8.9–10.3)
Chloride: 101 mmol/L (ref 98–111)
Creatinine, Ser: 1.41 mg/dL — ABNORMAL HIGH (ref 0.61–1.24)
GFR, Estimated: 57 mL/min — ABNORMAL LOW (ref >60)
Glucose, Bld: 179 mg/dL — ABNORMAL HIGH (ref 70–99)
Potassium: 3.8 mmol/L (ref 3.5–5.1)
Sodium: 133 mmol/L — ABNORMAL LOW (ref 135–145)

## 2023-08-29 LAB — BRAIN NATRIURETIC PEPTIDE: B Natriuretic Peptide: 85.5 pg/mL (ref 0.0–100.0)

## 2023-08-29 LAB — CBC WITH DIFFERENTIAL/PLATELET
Abs Immature Granulocytes: 0.02 10*3/uL (ref 0.00–0.07)
Basophils Absolute: 0 10*3/uL (ref 0.0–0.1)
Basophils Relative: 0 %
Eosinophils Absolute: 0 10*3/uL (ref 0.0–0.5)
Eosinophils Relative: 0 %
HCT: 35.9 % — ABNORMAL LOW (ref 39.0–52.0)
Hemoglobin: 12 g/dL — ABNORMAL LOW (ref 13.0–17.0)
Immature Granulocytes: 1 %
Lymphocytes Relative: 14 %
Lymphs Abs: 0.6 10*3/uL — ABNORMAL LOW (ref 0.7–4.0)
MCH: 32.8 pg (ref 26.0–34.0)
MCHC: 33.4 g/dL (ref 30.0–36.0)
MCV: 98.1 fL (ref 80.0–100.0)
Monocytes Absolute: 0.1 10*3/uL (ref 0.1–1.0)
Monocytes Relative: 3 %
Neutro Abs: 3.5 10*3/uL (ref 1.7–7.7)
Neutrophils Relative %: 82 %
Platelets: 191 10*3/uL (ref 150–400)
RBC: 3.66 MIL/uL — ABNORMAL LOW (ref 4.22–5.81)
RDW: 13.1 % (ref 11.5–15.5)
WBC: 4.2 10*3/uL (ref 4.0–10.5)
nRBC: 0 % (ref 0.0–0.2)

## 2023-08-29 MED ORDER — SACUBITRIL-VALSARTAN 24-26 MG PO TABS
1.0000 | ORAL_TABLET | Freq: Two times a day (BID) | ORAL | Status: DC
  Administered 2023-08-29 – 2023-08-31 (×5): 1 via ORAL
  Filled 2023-08-29 (×5): qty 1

## 2023-08-29 MED ORDER — ENOXAPARIN SODIUM 40 MG/0.4ML IJ SOSY
40.0000 mg | PREFILLED_SYRINGE | Freq: Every day | INTRAMUSCULAR | Status: DC
  Administered 2023-08-29 – 2023-08-31 (×3): 40 mg via SUBCUTANEOUS
  Filled 2023-08-29 (×3): qty 0.4

## 2023-08-29 NOTE — Plan of Care (Signed)
   Problem: Education: Goal: Knowledge of General Education information will improve Description Including pain rating scale, medication(s)/side effects and non-pharmacologic comfort measures Outcome: Progressing

## 2023-08-29 NOTE — Progress Notes (Signed)
 Progress Note  Patient Name: Thomas Gabrielle Sr. Date of Encounter: 08/29/2023  Primary Cardiologist:   None   Subjective   Breathing is better but not at baseline.  No pain.    Inpatient Medications    Scheduled Meds:  docusate sodium   100 mg Oral BID   ipratropium-albuterol   3 mL Nebulization Q6H   methylPREDNISolone  (SOLU-MEDROL ) injection  80 mg Intravenous Q12H   mometasone -formoterol   2 puff Inhalation BID   sodium chloride  flush  3 mL Intravenous Q12H   Continuous Infusions:  PRN Meds: acetaminophen  **OR** acetaminophen , albuterol , bisacodyl , hydrALAZINE , morphine  injection, ondansetron  **OR** ondansetron  (ZOFRAN ) IV, oxyCODONE , polyethylene glycol   Vital Signs    Vitals:   08/28/23 2334 08/29/23 0603 08/29/23 0800 08/29/23 0842  BP: 121/68 110/61 107/66   Pulse: 79 76 99   Resp: 19 18 (!) 28   Temp: 97.8 F (36.6 C) (!) 97.3 F (36.3 C) (!) 97.4 F (36.3 C) (!) 97.4 F (36.3 C)  TempSrc: Oral Axillary Oral Oral  SpO2: 97% 98% 95%   Weight:  68.4 kg    Height:        Intake/Output Summary (Last 24 hours) at 08/29/2023 1123 Last data filed at 08/28/2023 1751 Gross per 24 hour  Intake 185.46 ml  Output --  Net 185.46 ml   Filed Weights   08/28/23 0355 08/28/23 1753 08/29/23 0603  Weight: 74.8 kg 74.8 kg 68.4 kg    Telemetry    NSR - Personally Reviewed  ECG    NA - Personally Reviewed  Physical Exam   GEN: No acute distress.   Neck: No  JVD Cardiac: RRR, no murmurs, rubs, or gallops.  Respiratory:     Decreased breath sounds with expiratory wheezing.  GI: Soft, nontender, non-distended  MS: No  edema; No deformity. Neuro:  Nonfocal  Psych: Normal affect   Labs    Chemistry Recent Labs  Lab 08/27/23 2042 08/29/23 0236  NA 133* 133*  K 3.5 3.8  CL 101 101  CO2 24 20*  GLUCOSE 102* 179*  BUN 10 15  CREATININE 1.12 1.41*  CALCIUM 8.4* 9.0  GFRNONAA >60 57*  ANIONGAP 8 12     Hematology Recent Labs  Lab  08/27/23 2042 08/29/23 0236  WBC 5.4 4.2  RBC 3.48* 3.66*  HGB 11.4* 12.0*  HCT 34.3* 35.9*  MCV 98.6 98.1  MCH 32.8 32.8  MCHC 33.2 33.4  RDW 13.2 13.1  PLT 203 191    Cardiac EnzymesNo results for input(s): TROPONINI in the last 168 hours. No results for input(s): TROPIPOC in the last 168 hours.   BNPNo results for input(s): BNP, PROBNP in the last 168 hours.   DDimer No results for input(s): DDIMER in the last 168 hours.   Radiology    CT CORONARY MORPH W/CTA COR W/SCORE W/CA W/CM &/OR WO/CM Addendum Date: 08/28/2023 ADDENDUM REPORT: 08/28/2023 12:22 ADDENDUM: There is prior CT angiogram of the chest complete from 08/27/2023. Please correlate with that examination. Otherwise along the visualized lung bases there are areas of bronchial wall thickening along both lower lobes. The visualized portions of the lungs are without consolidation, pneumothorax or effusion. Mild debris along the right main bronchus. Visualized mediastinum is without lymph node enlargement. A few small less than 1 cm size short axis subcarinal and left hilar nodes are seen, nonpathologic by size criteria. The upper abdomen is barely included in the imaging field. Only the top of the liver and spleen are included  and are grossly preserved on this limited exam. Degenerative changes are seen along the spine. Electronically Signed   By: Ranell Bring M.D.   On: 08/28/2023 12:22   Result Date: 08/28/2023 CLINICAL DATA:  Chest pain EXAM: Cardiac CTA MEDICATIONS: Sub lingual nitro. 4mg  x 2 TECHNIQUE: The patient was scanned on a Siemens 192 slice scanner. Gantry rotation speed was 250 msecs. Collimation was 0.6 mm. A 100 kV prospective scan was triggered in the ascending thoracic aorta at 35-75% of the R-R interval. Average HR during the scan was 60 bpm. The 3D data set was interpreted on a dedicated work station using MPR, MIP and VRT modes. A total of 80cc of contrast was used. FINDINGS: Non-cardiac: See separate  report from [facility]. Pulmonary veins drain normally to the left atrium. No LA appendage thrombus. Calcium Score: 2.74 Agatston units. Coronary Arteries: Right dominant with no anomalies LM: No plaque or stenosis. LAD system:  No plaque or stenosis. Circumflex system: Calcified plaque ostial LCx, mild (1-24%) stenosis. RCA system: No plaque or stenosis. IMPRESSION: 1. Coronary artery calcium score 2.74 Agatston units. This places the patient in the 2nd percentile for age and gender, suggesting low risk for future cardiac events. 2.  Minimal coronary disease. Thomas [name] Electronically Signed: By: Ezra Shuck M.D. On: 08/28/2023 12:06   ECHOCARDIOGRAM COMPLETE Result Date: 08/28/2023    ECHOCARDIOGRAM REPORT   Patient Name:   Thomas Schillaci Sr. Date of Exam: 08/28/2023 Medical Rec #:  [id number]             Height:       68.0 in Accession #:    [id number]            Weight:       164.9 lb Date of Birth:  1961-05-16             BSA:          1.883 m Patient Age:    62 years              BP:           135/82 mmHg Patient Gender: M                     HR:           65 bpm. Exam Location:  Inpatient Procedure: 2D Echo, Cardiac Doppler, Color Doppler and Intracardiac            Opacification Agent Indications:    R07.9* Chest pain, unspecified  History:        Patient has no prior history of Echocardiogram examinations.                 NSTEMI; Signs/Symptoms:Shortness of Breath and Chest Pain.  Sonographer:    Lanell Maduro Referring Phys: [id number] Thomas [name] IMPRESSIONS  1. Left ventricular ejection fraction, by estimation, is 40 to 45%. The left ventricle has mildly decreased function. The left ventricle demonstrates regional wall motion abnormalities (see scoring diagram/findings for description). Left ventricular diastolic parameters are consistent with Grade I diastolic dysfunction (impaired relaxation).  2. Right ventricular systolic function is normal. The right ventricular size is normal.   3. The mitral valve is normal in structure. No evidence of mitral valve regurgitation. No evidence of mitral stenosis.  4. The aortic valve is tricuspid. There is mild calcification of the aortic valve. Aortic valve regurgitation is not visualized. No aortic stenosis is present.  5. The inferior vena  cava is normal in size with greater than 50% respiratory variability, suggesting right atrial pressure of 3 mmHg. FINDINGS  Left Ventricle: Left ventricular ejection fraction, by estimation, is 40 to 45%. The left ventricle has mildly decreased function. The left ventricle demonstrates regional wall motion abnormalities. The left ventricular internal cavity size was normal in size. There is no left ventricular hypertrophy. Left ventricular diastolic parameters are consistent with Grade I diastolic dysfunction (impaired relaxation).  LV Wall Scoring: The mid and distal anterior septum, mid inferoseptal segment, and apical inferior segment are hypokinetic. Right Ventricle: The right ventricular size is normal. No increase in right ventricular wall thickness. Right ventricular systolic function is normal. Left Atrium: Left atrial size was normal in size. Right Atrium: Right atrial size was normal in size. Pericardium: There is no evidence of pericardial effusion. Mitral Valve: The mitral valve is normal in structure. No evidence of mitral valve regurgitation. No evidence of mitral valve stenosis. Tricuspid Valve: The tricuspid valve is normal in structure. Tricuspid valve regurgitation is trivial. No evidence of tricuspid stenosis. Aortic Valve: The aortic valve is tricuspid. There is mild calcification of the aortic valve. Aortic valve regurgitation is not visualized. No aortic stenosis is present. Pulmonic Valve: The pulmonic valve was normal in structure. Pulmonic valve regurgitation is not visualized. No evidence of pulmonic stenosis. Aorta: The aortic root is normal in size and structure. Venous: The inferior vena  cava is normal in size with greater than 50% respiratory variability, suggesting right atrial pressure of 3 mmHg. IAS/Shunts: No atrial level shunt detected by color flow Doppler.  LEFT VENTRICLE PLAX 2D LVIDd:         3.80 cm      Diastology LVIDs:         2.10 cm      LV e' medial:    6.37 cm/s LV PW:         0.90 cm      LV E/e' medial:  9.4 LV IVS:        0.90 cm      LV e' lateral:   9.64 cm/s LVOT diam:     2.10 cm      LV E/e' lateral: 6.2 LV SV:         46 LV SV Index:   24 LVOT Area:     3.46 cm  LV Volumes (MOD) LV vol d, MOD A2C: 100.0 ml LV vol d, MOD A4C: 91.8 ml LV vol s, MOD A2C: 47.7 ml LV vol s, MOD A4C: 51.5 ml LV SV MOD A2C:     52.3 ml LV SV MOD A4C:     91.8 ml LV SV MOD BP:      45.4 ml RIGHT VENTRICLE            IVC RV Basal diam:  3.00 cm    IVC diam: 1.90 cm RV S prime:     8.86 cm/s TAPSE (M-mode): 2.3 cm LEFT ATRIUM           Index       RIGHT ATRIUM          Index LA diam:      2.90 cm 1.54 cm/m  RA Area:     9.05 cm LA Vol (A2C): 16.9 ml 8.98 ml/m  RA Volume:   16.70 ml 8.87 ml/m LA Vol (A4C): 16.6 ml 8.82 ml/m  AORTIC VALVE LVOT Vmax:   72.50 cm/s LVOT Vmean:  43.700 cm/s LVOT VTI:  0.133 m  AORTA Ao Root diam: 2.80 cm MITRAL VALVE               TRICUSPID VALVE MV Area (PHT): 2.87 cm    TR Peak grad:   24.2 mmHg MV Decel Time: 264 msec    TR Vmax:        246.00 cm/s MV E velocity: 59.60 cm/s MV A velocity: 60.70 cm/s  SHUNTS MV E/A ratio:  0.98        Systemic VTI:  0.13 m                            Systemic Diam: 2.10 cm Toribio Fuel MD Electronically signed by Toribio Fuel MD Signature Date/Time: 08/28/2023/12:16:15 PM    Final    CT Angio Chest PE W and/or Wo Contrast Result Date: 08/27/2023 CLINICAL DATA:  Shortness of breath and cough, chest tightness EXAM: CT ANGIOGRAPHY CHEST WITH CONTRAST TECHNIQUE: Multidetector CT imaging of the chest was performed using the standard protocol during bolus administration of intravenous contrast. Multiplanar CT image  reconstructions and MIPs were obtained to evaluate the vascular anatomy. RADIATION DOSE REDUCTION: This exam was performed according to the departmental dose-optimization program which includes automated exposure control, adjustment of the mA and/or kV according to patient size and/or use of iterative reconstruction technique. CONTRAST:  75mL OMNIPAQUE  IOHEXOL  350 MG/ML SOLN COMPARISON:  Radiographs earlier today and CTA chest 04/23/2021 FINDINGS: Cardiovascular: Negative for acute pulmonary embolism. Normal caliber thoracic aorta. No pericardial effusion. Mediastinum/Nodes: Trachea and esophagus are unremarkable. No thoracic adenopathy Lungs/Pleura: No focal consolidation, pleural effusion, or pneumothorax. Similar mild bronchial wall thickening compared to 04/23/2021. Upper Abdomen: No acute abnormality. Musculoskeletal: No acute fracture. Review of the MIP images confirms the above findings. IMPRESSION: Negative for acute pulmonary embolism. Mild bronchitis/reactive airways. Electronically Signed   By: Norman Gatlin M.D.   On: 08/27/2023 22:28   DG Chest 2 View Result Date: 08/27/2023 CLINICAL DATA:  Chest pain and shortness of breath EXAM: CHEST - 2 VIEW COMPARISON:  05/07/2022 FINDINGS: The heart size and mediastinal contours are within normal limits. Both lungs are clear. The visualized skeletal structures are unremarkable. IMPRESSION: No active cardiopulmonary disease. Electronically Signed   By: Oneil Devonshire M.D.   On: 08/27/2023 20:57    Cardiac Studies   Echocardiogram 08/28/2023   1. Left ventricular ejection fraction, by estimation, is 40 to 45%. The  left ventricle has mildly decreased function. The left ventricle  demonstrates regional wall motion abnormalities (see scoring  diagram/findings for description). Left ventricular  diastolic parameters are consistent with Grade I diastolic dysfunction  (impaired relaxation).   2. Right ventricular systolic function is normal. The right  ventricular  size is normal.   3. The mitral valve is normal in structure. No evidence of mitral valve  regurgitation. No evidence of mitral stenosis.   4. The aortic valve is tricuspid. There is mild calcification of the  aortic valve. Aortic valve regurgitation is not visualized. No aortic  stenosis is present.   5. The inferior vena cava is normal in size with greater than 50%  respiratory variability, suggesting right atrial pressure of 3 mmHg.   Patient Profile     63 y.o. male with a hx of asthma who is being seen 08/28/2023 for the evaluation of chest pain, increased cough at the request of Dr. Armenta at [facility].   Assessment & Plan    NSTEMI:   Coronary  CT with minimal coronary plaque.  Elevated cardiac enzymes thought to be secondary to primary pulmonary process.  No further cardiac workup.   Cardiomyopathy: Nonischemic.  Etiology is not clear.  His blood pressure runs somewhat low.  I am going to start Entresto  at the lowest dose.  Will plan med titration as blood pressure allows.    He is still short of breath being treated for an asthma exacerbation.  I will check a BNP level.     AKI: His creatinine is up slightly which we can follow with repeat creatinine in the morning.    For questions or updates, please contact [facility] Please consult [email or web address] for contact info under Cardiology/STEMI.   Signed, Lynwood Schilling, MD  08/29/2023, 11:23 AM

## 2023-08-29 NOTE — Progress Notes (Signed)
 PROGRESS NOTE    Thomas Vaquera Sr.  FMW:[id number] DOB: 17-Sep-1960 DOA: 08/27/2023 PCP: Ozell Heron HERO, MD   Brief Narrative: 63 year old with past medical history significant for asthma presented 1/9 with shortness of breath.  He reports that he developed acute onset of shortness of breath the day prior to admission.  He has history of asthma, used his MDI without improvement.  Found to  have non-STEMI, cardiology following.   Assessment & Plan:   Principal Problem:   Exacerbation of asthma Active Problems:   Moderate persistent asthma   Troponin level elevated   Elevated blood pressure reading   1-Asthma exacerbation: -Presented with shortness of breath, history of moderate persistent asthma. -COVID flu RSV negative -Continue with nebulizer -IV Solu-Medrol  -Continue Dulera  Still SOB  2-non-STEMI likely type II demand ischemia , elevated troponin: Non Ischemic Cardiomyopathy  -EKG unremarkable -Cardiology  following -Follow CTA and echo CTA score 2.7 low risk for future cardiac events.  Echo ejection fraction 40 to 45%.  Left ventricle demonstrates regional wall motion abnormality. -Cardiology think mild reduced left ventricular ejection fraction related to  nonischemic cardiomyopathy. Unclear etiology.  Plan to start Entresto .   Mild Increase Cr Monitor. To determine if AKI  Estimated body mass index is 22.93 kg/m as calculated from the following:   Height as of this encounter: 5' 8 (1.727 m).   Weight as of this encounter: 68.4 kg.   DVT prophylaxis: Lovenox  Code Status: Full code Family Communication: Care discussed with patient and wife who was at bedside.  Disposition Plan:  Status is: Observation The patient remains OBS appropriate and will d/c before 2 midnights.    Consultants:  Cardiology   Procedures:  none  Antimicrobials:    Subjective: He is alert, conversant, report still having SOB,  does not feel ready for discharge    Objective: Vitals:   08/28/23 1823 08/28/23 2103 08/28/23 2334 08/29/23 0603  BP:  116/83 121/68 110/61  Pulse:  74 79 76  Resp:  20 19 18   Temp:  97.6 F (36.4 C) 97.8 F (36.6 C) (!) 97.3 F (36.3 C)  TempSrc:  Oral Oral Axillary  SpO2: 97% 95% 97% 98%  Weight:    68.4 kg  Height:        Intake/Output Summary (Last 24 hours) at 08/29/2023 0743 Last data filed at 08/28/2023 1751 Gross per 24 hour  Intake 185.46 ml  Output --  Net 185.46 ml   Filed Weights   08/28/23 0355 08/28/23 1753 08/29/23 0603  Weight: 74.8 kg 74.8 kg 68.4 kg    Examination:  General exam: Appears calm and comfortable  Respiratory system: BL air movement , no significant wheezing.  Respiratory effort normal. Cardiovascular system: S1 & S2 heard, RRR.  Gastrointestinal system: Abdomen is nondistended, soft and nontender. No organomegaly or masses felt. Normal bowel sounds heard. Central nervous system: Alert and oriented. No focal neurological deficits. Extremities: Symmetric 5 x 5 power. Skin: No rashes, lesions or ulcers Psychiatry: Judgement and insight appear normal. Mood & affect appropriate.     Data Reviewed: I have personally reviewed following labs and imaging studies  CBC: Recent Labs  Lab 08/27/23 2042 08/29/23 0236  WBC 5.4 4.2  NEUTROABS  --  3.5  HGB 11.4* 12.0*  HCT 34.3* 35.9*  MCV 98.6 98.1  PLT 203 191   Basic Metabolic Panel: Recent Labs  Lab 08/27/23 2042 08/29/23 0236  NA 133* 133*  K 3.5 3.8  CL 101 101  CO2  24 20*  GLUCOSE 102* 179*  BUN 10 15  CREATININE 1.12 1.41*  CALCIUM 8.4* 9.0   GFR: Estimated Creatinine Clearance: 53.2 mL/min (A) (by C-G formula based on SCr of 1.41 mg/dL (H)). Liver Function Tests: No results for input(s): AST, ALT, ALKPHOS, BILITOT, PROT, ALBUMIN in the last 168 hours. No results for input(s): LIPASE, AMYLASE in the last 168 hours. No results for input(s): AMMONIA in the last 168 hours. Coagulation  Profile: No results for input(s): INR, PROTIME in the last 168 hours. Cardiac Enzymes: No results for input(s): CKTOTAL, CKMB, CKMBINDEX, TROPONINI in the last 168 hours. BNP (last 3 results) No results for input(s): PROBNP in the last 8760 hours. HbA1C: Recent Labs    08/28/23 0951  HGBA1C 5.5   CBG: No results for input(s): GLUCAP in the last 168 hours. Lipid Profile: Recent Labs    08/28/23 0810  CHOL 140  HDL 53  LDLCALC 80  TRIG 35  CHOLHDL 2.6   Thyroid Function Tests: Recent Labs    08/28/23 0810  TSH 0.315*   Anemia Panel: No results for input(s): VITAMINB12, FOLATE, FERRITIN, TIBC, IRON, RETICCTPCT in the last 72 hours. Sepsis Labs: No results for input(s): PROCALCITON, LATICACIDVEN in the last 168 hours.  Recent Results (from the past 240 hours)  Resp panel by RT-PCR (RSV, Flu A&B, Covid) Anterior Nasal Swab     Status: None   Collection Time: 08/27/23  9:19 PM   Specimen: Anterior Nasal Swab  Result Value Ref Range Status   SARS Coronavirus 2 by RT PCR NEGATIVE NEGATIVE Final    Comment: (NOTE) SARS-CoV-2 target nucleic acids are NOT DETECTED.  The SARS-CoV-2 RNA is generally detectable in upper respiratory specimens during the acute phase of infection. The lowest concentration of SARS-CoV-2 viral copies this assay can detect is 138 copies/mL. A negative result does not preclude SARS-Cov-2 infection and should not be used as the sole basis for treatment or other patient management decisions. A negative result may occur with  improper specimen collection/handling, submission of specimen other than nasopharyngeal swab, presence of viral mutation(s) within the areas targeted by this assay, and inadequate number of viral copies(<138 copies/mL). A negative result must be combined with clinical observations, patient history, and epidemiological information. The expected result is Negative.  Fact Sheet for Patients:   bloggercourse.com  Fact Sheet for Healthcare Providers:  seriousbroker.it  This test is no t yet approved or cleared by the [place]  FDA and  has been authorized for detection and/or diagnosis of SARS-CoV-2 by FDA under an Emergency Use Authorization (EUA). This EUA will remain  in effect (meaning this test can be used) for the duration of the COVID-19 declaration under Section 564(b)(1) of the Act, 21 U.S.C.section 360bbb-3(b)(1), unless the authorization is terminated  or revoked sooner.       Influenza A by PCR NEGATIVE NEGATIVE Final   Influenza B by PCR NEGATIVE NEGATIVE Final    Comment: (NOTE) The Xpert Xpress SARS-CoV-2/FLU/RSV plus assay is intended as an aid in the diagnosis of influenza from Nasopharyngeal swab specimens and should not be used as a sole basis for treatment. Nasal washings and aspirates are unacceptable for Xpert Xpress SARS-CoV-2/FLU/RSV testing.  Fact Sheet for Patients: bloggercourse.com  Fact Sheet for Healthcare Providers: seriousbroker.it  This test is not yet approved or cleared by the [place]  FDA and has been authorized for detection and/or diagnosis of SARS-CoV-2 by FDA under an Emergency Use Authorization (EUA). This EUA will remain in effect (  meaning this test can be used) for the duration of the COVID-19 declaration under Section 564(b)(1) of the Act, 21 U.S.C. section 360bbb-3(b)(1), unless the authorization is terminated or revoked.     Resp Syncytial Virus by PCR NEGATIVE NEGATIVE Final    Comment: (NOTE) Fact Sheet for Patients: bloggercourse.com  Fact Sheet for Healthcare Providers: seriousbroker.it  This test is not yet approved or cleared by the [place]  FDA and has been authorized for detection and/or diagnosis of SARS-CoV-2 by FDA under an Emergency Use  Authorization (EUA). This EUA will remain in effect (meaning this test can be used) for the duration of the COVID-19 declaration under Section 564(b)(1) of the Act, 21 U.S.C. section 360bbb-3(b)(1), unless the authorization is terminated or revoked.  Performed at [facility], [address] [place], [place] [address]          Radiology Studies: CT CORONARY [facility] W/CTA COR W/SCORE W/CA W/CM &/OR WO/CM Addendum Date: 08/28/2023 ADDENDUM REPORT: 08/28/2023 12:22 ADDENDUM: There is prior CT angiogram of the chest complete from 08/27/2023. Please correlate with that examination. Otherwise along the visualized lung bases there are areas of bronchial wall thickening along both lower lobes. The visualized portions of the lungs are without consolidation, pneumothorax or effusion. Mild debris along the right main bronchus. Visualized mediastinum is without lymph node enlargement. A few small less than 1 cm size short axis subcarinal and left hilar nodes are seen, nonpathologic by size criteria. The upper abdomen is barely included in the imaging field. Only the top of the liver and spleen are included and are grossly preserved on this limited exam. Degenerative changes are seen along the spine. Electronically Signed   By: Ranell Bring M.D.   On: 08/28/2023 12:22   Result Date: 08/28/2023 CLINICAL DATA:  Chest pain EXAM: Cardiac CTA MEDICATIONS: Sub lingual nitro. 4mg  x 2 TECHNIQUE: The patient was scanned on a Siemens 192 slice scanner. Gantry rotation speed was 250 msecs. Collimation was 0.6 mm. A 100 kV prospective scan was triggered in the ascending thoracic aorta at 35-75% of the R-R interval. Average HR during the scan was 60 bpm. The 3D data set was interpreted on a dedicated work station using MPR, MIP and VRT modes. A total of 80cc of contrast was used. FINDINGS: Non-cardiac: See separate report from [facility]. Pulmonary veins drain normally to the left atrium. No LA  appendage thrombus. Calcium Score: 2.74 Agatston units. Coronary Arteries: Right dominant with no anomalies LM: No plaque or stenosis. LAD system:  No plaque or stenosis. Circumflex system: Calcified plaque ostial LCx, mild (1-24%) stenosis. RCA system: No plaque or stenosis. IMPRESSION: 1. Coronary artery calcium score 2.74 Agatston units. This places the patient in the 2nd percentile for age and gender, suggesting low risk for future cardiac events. 2.  Minimal coronary disease. [name] Electronically Signed: By: Ezra Shuck M.D. On: 08/28/2023 12:06   ECHOCARDIOGRAM COMPLETE Result Date: 08/28/2023    ECHOCARDIOGRAM REPORT   Patient Name:   Thomas Leverich Sr. Date of Exam: 08/28/2023 Medical Rec #:  [id number]             Height:       68.0 in Accession #:    [id number]            Weight:       164.9 lb Date of Birth:  02/07/1961             BSA:  1.883 m Patient Age:    62 years              BP:           135/82 mmHg Patient Gender: M                     HR:           65 bpm. Exam Location:  Inpatient Procedure: 2D Echo, Cardiac Doppler, Color Doppler and Intracardiac            Opacification Agent Indications:    R07.9* Chest pain, unspecified  History:        Patient has no prior history of Echocardiogram examinations.                 NSTEMI; Signs/Symptoms:Shortness of Breath and Chest Pain.  Sonographer:    Lanell Maduro Referring Phys: [id number] ARTIST POUCH IMPRESSIONS  1. Left ventricular ejection fraction, by estimation, is 40 to 45%. The left ventricle has mildly decreased function. The left ventricle demonstrates regional wall motion abnormalities (see scoring diagram/findings for description). Left ventricular diastolic parameters are consistent with Grade I diastolic dysfunction (impaired relaxation).  2. Right ventricular systolic function is normal. The right ventricular size is normal.  3. The mitral valve is normal in structure. No evidence of mitral valve regurgitation. No  evidence of mitral stenosis.  4. The aortic valve is tricuspid. There is mild calcification of the aortic valve. Aortic valve regurgitation is not visualized. No aortic stenosis is present.  5. The inferior vena cava is normal in size with greater than 50% respiratory variability, suggesting right atrial pressure of 3 mmHg. FINDINGS  Left Ventricle: Left ventricular ejection fraction, by estimation, is 40 to 45%. The left ventricle has mildly decreased function. The left ventricle demonstrates regional wall motion abnormalities. The left ventricular internal cavity size was normal in size. There is no left ventricular hypertrophy. Left ventricular diastolic parameters are consistent with Grade I diastolic dysfunction (impaired relaxation).  LV Wall Scoring: The mid and distal anterior septum, mid inferoseptal segment, and apical inferior segment are hypokinetic. Right Ventricle: The right ventricular size is normal. No increase in right ventricular wall thickness. Right ventricular systolic function is normal. Left Atrium: Left atrial size was normal in size. Right Atrium: Right atrial size was normal in size. Pericardium: There is no evidence of pericardial effusion. Mitral Valve: The mitral valve is normal in structure. No evidence of mitral valve regurgitation. No evidence of mitral valve stenosis. Tricuspid Valve: The tricuspid valve is normal in structure. Tricuspid valve regurgitation is trivial. No evidence of tricuspid stenosis. Aortic Valve: The aortic valve is tricuspid. There is mild calcification of the aortic valve. Aortic valve regurgitation is not visualized. No aortic stenosis is present. Pulmonic Valve: The pulmonic valve was normal in structure. Pulmonic valve regurgitation is not visualized. No evidence of pulmonic stenosis. Aorta: The aortic root is normal in size and structure. Venous: The inferior vena cava is normal in size with greater than 50% respiratory variability, suggesting right atrial  pressure of 3 mmHg. IAS/Shunts: No atrial level shunt detected by color flow Doppler.  LEFT VENTRICLE PLAX 2D LVIDd:         3.80 cm      Diastology LVIDs:         2.10 cm      LV e' medial:    6.37 cm/s LV PW:         0.90 cm  LV E/e' medial:  9.4 LV IVS:        0.90 cm      LV e' lateral:   9.64 cm/s LVOT diam:     2.10 cm      LV E/e' lateral: 6.2 LV SV:         46 LV SV Index:   24 LVOT Area:     3.46 cm  LV Volumes (MOD) LV vol d, MOD A2C: 100.0 ml LV vol d, MOD A4C: 91.8 ml LV vol s, MOD A2C: 47.7 ml LV vol s, MOD A4C: 51.5 ml LV SV MOD A2C:     52.3 ml LV SV MOD A4C:     91.8 ml LV SV MOD BP:      45.4 ml RIGHT VENTRICLE            IVC RV Basal diam:  3.00 cm    IVC diam: 1.90 cm RV S prime:     8.86 cm/s TAPSE (M-mode): 2.3 cm LEFT ATRIUM           Index       RIGHT ATRIUM          Index LA diam:      2.90 cm 1.54 cm/m  RA Area:     9.05 cm LA Vol (A2C): 16.9 ml 8.98 ml/m  RA Volume:   16.70 ml 8.87 ml/m LA Vol (A4C): 16.6 ml 8.82 ml/m  AORTIC VALVE LVOT Vmax:   72.50 cm/s LVOT Vmean:  43.700 cm/s LVOT VTI:    0.133 m  AORTA Ao Root diam: 2.80 cm MITRAL VALVE               TRICUSPID VALVE MV Area (PHT): 2.87 cm    TR Peak grad:   24.2 mmHg MV Decel Time: 264 msec    TR Vmax:        246.00 cm/s MV E velocity: 59.60 cm/s MV A velocity: 60.70 cm/s  SHUNTS MV E/A ratio:  0.98        Systemic VTI:  0.13 m                            Systemic Diam: 2.10 cm Toribio Fuel MD Electronically signed by Toribio Fuel MD Signature Date/Time: 08/28/2023/12:16:15 PM    Final    CT Angio Chest PE W and/or Wo Contrast Result Date: 08/27/2023 CLINICAL DATA:  Shortness of breath and cough, chest tightness EXAM: CT ANGIOGRAPHY CHEST WITH CONTRAST TECHNIQUE: Multidetector CT imaging of the chest was performed using the standard protocol during bolus administration of intravenous contrast. Multiplanar CT image reconstructions and MIPs were obtained to evaluate the vascular anatomy. RADIATION DOSE REDUCTION: This  exam was performed according to the departmental dose-optimization program which includes automated exposure control, adjustment of the mA and/or kV according to patient size and/or use of iterative reconstruction technique. CONTRAST:  75mL OMNIPAQUE  IOHEXOL  350 MG/ML SOLN COMPARISON:  Radiographs earlier today and CTA chest 04/23/2021 FINDINGS: Cardiovascular: Negative for acute pulmonary embolism. Normal caliber thoracic aorta. No pericardial effusion. Mediastinum/Nodes: Trachea and esophagus are unremarkable. No thoracic adenopathy Lungs/Pleura: No focal consolidation, pleural effusion, or pneumothorax. Similar mild bronchial wall thickening compared to 04/23/2021. Upper Abdomen: No acute abnormality. Musculoskeletal: No acute fracture. Review of the MIP images confirms the above findings. IMPRESSION: Negative for acute pulmonary embolism. Mild bronchitis/reactive airways. Electronically Signed   By: Norman Gatlin M.D.   On: 08/27/2023 22:28  DG Chest 2 View Result Date: 08/27/2023 CLINICAL DATA:  Chest pain and shortness of breath EXAM: CHEST - 2 VIEW COMPARISON:  05/07/2022 FINDINGS: The heart size and mediastinal contours are within normal limits. Both lungs are clear. The visualized skeletal structures are unremarkable. IMPRESSION: No active cardiopulmonary disease. Electronically Signed   By: Oneil Devonshire M.D.   On: 08/27/2023 20:57        Scheduled Meds:  docusate sodium   100 mg Oral BID   ipratropium-albuterol   3 mL Nebulization Q6H   methylPREDNISolone  (SOLU-MEDROL ) injection  80 mg Intravenous Q12H   mometasone -formoterol   2 puff Inhalation BID   sodium chloride  flush  3 mL Intravenous Q12H   Continuous Infusions:   LOS: 0 days    Time spent: 35 minutes    Latravion Graves A Salome Hautala, MD [facility]   If 7PM-7AM, please contact night-coverage [email or web address]  08/29/2023, 7:43 AM

## 2023-08-30 ENCOUNTER — Telehealth: Payer: Self-pay | Admitting: Physician Assistant

## 2023-08-30 DIAGNOSIS — Z1152 Encounter for screening for COVID-19: Secondary | ICD-10-CM | POA: Diagnosis not present

## 2023-08-30 DIAGNOSIS — R0602 Shortness of breath: Secondary | ICD-10-CM | POA: Diagnosis present

## 2023-08-30 DIAGNOSIS — I21A1 Myocardial infarction type 2: Secondary | ICD-10-CM | POA: Diagnosis present

## 2023-08-30 DIAGNOSIS — I10 Essential (primary) hypertension: Secondary | ICD-10-CM | POA: Diagnosis present

## 2023-08-30 DIAGNOSIS — D649 Anemia, unspecified: Secondary | ICD-10-CM | POA: Diagnosis present

## 2023-08-30 DIAGNOSIS — I214 Non-ST elevation (NSTEMI) myocardial infarction: Secondary | ICD-10-CM | POA: Diagnosis not present

## 2023-08-30 DIAGNOSIS — Z79899 Other long term (current) drug therapy: Secondary | ICD-10-CM | POA: Diagnosis not present

## 2023-08-30 DIAGNOSIS — I5021 Acute systolic (congestive) heart failure: Secondary | ICD-10-CM | POA: Diagnosis not present

## 2023-08-30 DIAGNOSIS — N179 Acute kidney failure, unspecified: Secondary | ICD-10-CM | POA: Diagnosis present

## 2023-08-30 DIAGNOSIS — J40 Bronchitis, not specified as acute or chronic: Secondary | ICD-10-CM | POA: Diagnosis present

## 2023-08-30 DIAGNOSIS — R000 Tachycardia, unspecified: Secondary | ICD-10-CM | POA: Diagnosis present

## 2023-08-30 DIAGNOSIS — J4541 Moderate persistent asthma with (acute) exacerbation: Secondary | ICD-10-CM | POA: Diagnosis present

## 2023-08-30 DIAGNOSIS — E785 Hyperlipidemia, unspecified: Secondary | ICD-10-CM | POA: Diagnosis present

## 2023-08-30 DIAGNOSIS — I428 Other cardiomyopathies: Secondary | ICD-10-CM | POA: Diagnosis present

## 2023-08-30 DIAGNOSIS — E871 Hypo-osmolality and hyponatremia: Secondary | ICD-10-CM | POA: Diagnosis present

## 2023-08-30 DIAGNOSIS — Z7951 Long term (current) use of inhaled steroids: Secondary | ICD-10-CM | POA: Diagnosis not present

## 2023-08-30 LAB — BASIC METABOLIC PANEL
Anion gap: 10 (ref 5–15)
BUN: 31 mg/dL — ABNORMAL HIGH (ref 8–23)
CO2: 23 mmol/L (ref 22–32)
Calcium: 8.8 mg/dL — ABNORMAL LOW (ref 8.9–10.3)
Chloride: 102 mmol/L (ref 98–111)
Creatinine, Ser: 1.5 mg/dL — ABNORMAL HIGH (ref 0.61–1.24)
GFR, Estimated: 53 mL/min — ABNORMAL LOW (ref >60)
Glucose, Bld: 135 mg/dL — ABNORMAL HIGH (ref 70–99)
Potassium: 4.2 mmol/L (ref 3.5–5.1)
Sodium: 135 mmol/L (ref 135–145)

## 2023-08-30 LAB — T4, FREE: Free T4: 0.95 ng/dL (ref 0.61–1.12)

## 2023-08-30 NOTE — Progress Notes (Addendum)
 Progress Note  Patient Name: Thomas Labrosse Sr. Date of Encounter: 08/30/2023  Primary Cardiologist:   None   Subjective   Breathing is better.  Reports close to baseline.  No pain.   Inpatient Medications    Scheduled Meds:  docusate sodium   100 mg Oral BID   enoxaparin  (LOVENOX ) injection  40 mg Subcutaneous Daily   ipratropium-albuterol   3 mL Nebulization Q6H   methylPREDNISolone  (SOLU-MEDROL ) injection  80 mg Intravenous Q12H   mometasone -formoterol   2 puff Inhalation BID   sacubitril -valsartan   1 tablet Oral BID   sodium chloride  flush  3 mL Intravenous Q12H   Continuous Infusions:  PRN Meds: acetaminophen  **OR** acetaminophen , albuterol , bisacodyl , hydrALAZINE , morphine  injection, ondansetron  **OR** ondansetron  (ZOFRAN ) IV, oxyCODONE , polyethylene glycol   Vital Signs    Vitals:   08/30/23 0244 08/30/23 0424 08/30/23 0800 08/30/23 0850  BP:  109/65  103/61  Pulse: 80 98 95 100  Resp: 14 19 18  (!) 23  Temp:  (!) 97.3 F (36.3 C)  97.9 F (36.6 C)  TempSrc:  Oral  Oral  SpO2:  99%  91%  Weight:  67.8 kg    Height:        Intake/Output Summary (Last 24 hours) at 08/30/2023 1015 Last data filed at 08/30/2023 0432 Gross per 24 hour  Intake 480 ml  Output --  Net 480 ml   Filed Weights   08/28/23 1753 08/29/23 0603 08/30/23 0424  Weight: 74.8 kg 68.4 kg 67.8 kg    Telemetry    NSR - Personally Reviewed  ECG    NA - Personally Reviewed  Physical Exam   GEN: No  acute distress.   Neck: No  JVD Cardiac: RRR, no murmurs, rubs, or gallops.  Respiratory:     Decreased breath sounds with mild wheezing.  GI: Soft, nontender, non-distended, normal bowel sounds  MS:  No edema; No deformity. Neuro:   Nonfocal  Psych: Oriented and appropriate    Labs    Chemistry Recent Labs  Lab 08/27/23 2042 08/29/23 0236 08/30/23 0240  NA 133* 133* 135  K 3.5 3.8 4.2  CL 101 101 102  CO2 24 20* 23  GLUCOSE 102* 179* 135*  BUN 10 15 31*  CREATININE  1.12 1.41* 1.50*  CALCIUM 8.4* 9.0 8.8*  GFRNONAA >60 57* 53*  ANIONGAP 8 12 10      Hematology Recent Labs  Lab 08/27/23 2042 08/29/23 0236  WBC 5.4 4.2  RBC 3.48* 3.66*  HGB 11.4* 12.0*  HCT 34.3* 35.9*  MCV 98.6 98.1  MCH 32.8 32.8  MCHC 33.2 33.4  RDW 13.2 13.1  PLT 203 191    Cardiac EnzymesNo results for input(s): TROPONINI in the last 168 hours. No results for input(s): TROPIPOC in the last 168 hours.   BNP Recent Labs  Lab 08/29/23 1408  BNP 85.5     DDimer No results for input(s): DDIMER in the last 168 hours.   Radiology    CT CORONARY MORPH W/CTA COR W/SCORE W/CA W/CM &/OR WO/CM Addendum Date: 08/28/2023 ADDENDUM REPORT: 08/28/2023 12:22 ADDENDUM: There is prior CT angiogram of the chest complete from 08/27/2023. Please correlate with that examination. Otherwise along the visualized lung bases there are areas of bronchial wall thickening along both lower lobes. The visualized portions of the lungs are without consolidation, pneumothorax or effusion. Mild debris along the right main bronchus. Visualized mediastinum is without lymph node enlargement. A few small less than 1 cm size short axis subcarinal  and left hilar nodes are seen, nonpathologic by size criteria. The upper abdomen is barely included in the imaging field. Only the top of the liver and spleen are included and are grossly preserved on this limited exam. Degenerative changes are seen along the spine. Electronically Signed   By: Ranell Bring M.D.   On: 08/28/2023 12:22   Result Date: 08/28/2023 CLINICAL DATA:  Chest pain EXAM: Cardiac CTA MEDICATIONS: Sub lingual nitro. 4mg  x 2 TECHNIQUE: The patient was scanned on a Siemens 192 slice scanner. Gantry rotation speed was 250 msecs. Collimation was 0.6 mm. A 100 kV prospective scan was triggered in the ascending thoracic aorta at 35-75% of the R-R interval. Average HR during the scan was 60 bpm. The 3D data set was interpreted on a dedicated work station  using MPR, MIP and VRT modes. A total of 80cc of contrast was used. FINDINGS: Non-cardiac: See separate report from [facility]. Pulmonary veins drain normally to the left atrium. No LA appendage thrombus. Calcium Score: 2.74 Agatston units. Coronary Arteries: Right dominant with no anomalies LM: No plaque or stenosis. LAD system:  No plaque or stenosis. Circumflex system: Calcified plaque ostial LCx, mild (1-24%) stenosis. RCA system: No plaque or stenosis. IMPRESSION: 1. Coronary artery calcium score 2.74 Agatston units. This places the patient in the 2nd percentile for age and gender, suggesting low risk for future cardiac events. 2.  Minimal coronary disease. [name] Electronically Signed: By: Ezra Shuck M.D. On: 08/28/2023 12:06   ECHOCARDIOGRAM COMPLETE Result Date: 08/28/2023    ECHOCARDIOGRAM REPORT   Patient Name:   Thomas Meeker Sr. Date of Exam: 08/28/2023 Medical Rec #:  [id number]             Height:       68.0 in Accession #:    [id number]            Weight:       164.9 lb Date of Birth:  November 25, 1960             BSA:          1.883 m Patient Age:    62 years              BP:           135/82 mmHg Patient Gender: M                     HR:           65 bpm. Exam Location:  Inpatient Procedure: 2D Echo, Cardiac Doppler, Color Doppler and Intracardiac            Opacification Agent Indications:    R07.9* Chest pain, unspecified  History:        Patient has no prior history of Echocardiogram examinations.                 NSTEMI; Signs/Symptoms:Shortness of Breath and Chest Pain.  Sonographer:    Lanell Maduro Referring Phys: [id number] ARTIST POUCH IMPRESSIONS  1. Left ventricular ejection fraction, by estimation, is 40 to 45%. The left ventricle has mildly decreased function. The left ventricle demonstrates regional wall motion abnormalities (see scoring diagram/findings for description). Left ventricular diastolic parameters are consistent with Grade I diastolic dysfunction  (impaired relaxation).  2. Right ventricular systolic function is normal. The right ventricular size is normal.  3. The mitral valve is normal in structure. No evidence of mitral valve regurgitation. No evidence of mitral stenosis.  4. The aortic valve is tricuspid. There is mild calcification of the aortic valve. Aortic valve regurgitation is not visualized. No aortic stenosis is present.  5. The inferior vena cava is normal in size with greater than 50% respiratory variability, suggesting right atrial pressure of 3 mmHg. FINDINGS  Left Ventricle: Left ventricular ejection fraction, by estimation, is 40 to 45%. The left ventricle has mildly decreased function. The left ventricle demonstrates regional wall motion abnormalities. The left ventricular internal cavity size was normal in size. There is no left ventricular hypertrophy. Left ventricular diastolic parameters are consistent with Grade I diastolic dysfunction (impaired relaxation).  LV Wall Scoring: The mid and distal anterior septum, mid inferoseptal segment, and apical inferior segment are hypokinetic. Right Ventricle: The right ventricular size is normal. No increase in right ventricular wall thickness. Right ventricular systolic function is normal. Left Atrium: Left atrial size was normal in size. Right Atrium: Right atrial size was normal in size. Pericardium: There is no evidence of pericardial effusion. Mitral Valve: The mitral valve is normal in structure. No evidence of mitral valve regurgitation. No evidence of mitral valve stenosis. Tricuspid Valve: The tricuspid valve is normal in structure. Tricuspid valve regurgitation is trivial. No evidence of tricuspid stenosis. Aortic Valve: The aortic valve is tricuspid. There is mild calcification of the aortic valve. Aortic valve regurgitation is not visualized. No aortic stenosis is present. Pulmonic Valve: The pulmonic valve was normal in structure. Pulmonic valve regurgitation is not visualized. No  evidence of pulmonic stenosis. Aorta: The aortic root is normal in size and structure. Venous: The inferior vena cava is normal in size with greater than 50% respiratory variability, suggesting right atrial pressure of 3 mmHg. IAS/Shunts: No atrial level shunt detected by color flow Doppler.  LEFT VENTRICLE PLAX 2D LVIDd:         3.80 cm      Diastology LVIDs:         2.10 cm      LV e' medial:    6.37 cm/s LV PW:         0.90 cm      LV E/e' medial:  9.4 LV IVS:        0.90 cm      LV e' lateral:   9.64 cm/s LVOT diam:     2.10 cm      LV E/e' lateral: 6.2 LV SV:         46 LV SV Index:   24 LVOT Area:     3.46 cm  LV Volumes (MOD) LV vol d, MOD A2C: 100.0 ml LV vol d, MOD A4C: 91.8 ml LV vol s, MOD A2C: 47.7 ml LV vol s, MOD A4C: 51.5 ml LV SV MOD A2C:     52.3 ml LV SV MOD A4C:     91.8 ml LV SV MOD BP:      45.4 ml RIGHT VENTRICLE            IVC RV Basal diam:  3.00 cm    IVC diam: 1.90 cm RV S prime:     8.86 cm/s TAPSE (M-mode): 2.3 cm LEFT ATRIUM           Index       RIGHT ATRIUM          Index LA diam:      2.90 cm 1.54 cm/m  RA Area:     9.05 cm LA Vol (A2C): 16.9 ml 8.98 ml/m  RA Volume:  16.70 ml 8.87 ml/m LA Vol (A4C): 16.6 ml 8.82 ml/m  AORTIC VALVE LVOT Vmax:   72.50 cm/s LVOT Vmean:  43.700 cm/s LVOT VTI:    0.133 m  AORTA Ao Root diam: 2.80 cm MITRAL VALVE               TRICUSPID VALVE MV Area (PHT): 2.87 cm    TR Peak grad:   24.2 mmHg MV Decel Time: 264 msec    TR Vmax:        246.00 cm/s MV E velocity: 59.60 cm/s MV A velocity: 60.70 cm/s  SHUNTS MV E/A ratio:  0.98        Systemic VTI:  0.13 m                            Systemic Diam: 2.10 cm Toribio Fuel MD Electronically signed by Toribio Fuel MD Signature Date/Time: 08/28/2023/12:16:15 PM    Final     Cardiac Studies   Echocardiogram 08/28/2023   1. Left ventricular ejection fraction, by estimation, is 40 to 45%. The  left ventricle has mildly decreased function. The left ventricle  demonstrates regional wall motion  abnormalities (see scoring  diagram/findings for description). Left ventricular  diastolic parameters are consistent with Grade I diastolic dysfunction  (impaired relaxation).   2. Right ventricular systolic function is normal. The right ventricular  size is normal.   3. The mitral valve is normal in structure. No evidence of mitral valve  regurgitation. No evidence of mitral stenosis.   4. The aortic valve is tricuspid. There is mild calcification of the  aortic valve. Aortic valve regurgitation is not visualized. No aortic  stenosis is present.   5. The inferior vena cava is normal in size with greater than 50%  respiratory variability, suggesting right atrial pressure of 3 mmHg.   Patient Profile     63 y.o. male with a hx of asthma who is being seen 08/28/2023 for the evaluation of chest pain, increased cough at the request of Dr. Armenta at [facility].   Assessment & Plan    NSTEMI:   Coronary CT with minimal coronary plaque.  Elevated cardiac enzymes thought to be secondary to primary pulmonary process.  No further cardiac work up.      Cardiomyopathy: Nonischemic.  Etiology is not clear.   I started low dose Entresto  yesterday.  Follow closely.  We will arrange med titration.  He will need TOC follow up for creat  and med titration.   Note:  BNP normal.   AKI: His creatinine is up slightly.  We will need to follow this closely on Entresto .  Avoid beta blocker with his severe asthma ongoing and history of severe reactive airway disease.    Decreased TSH:  Per primary team.     For questions or updates, please contact [facility] Please consult [email or web address] for contact info under Cardiology/STEMI.   Signed, Lynwood Schilling, MD  08/30/2023, 10:15 AM

## 2023-08-30 NOTE — Plan of Care (Signed)
  Problem: Education: Goal: Knowledge of General Education information will improve Description: Including pain rating scale, medication(s)/side effects and non-pharmacologic comfort measures Outcome: Progressing   Problem: Clinical Measurements: Goal: Ability to maintain clinical measurements within normal limits will improve Outcome: Progressing Goal: Respiratory complications will improve Outcome: Progressing   

## 2023-08-30 NOTE — Telephone Encounter (Signed)
 Pt DC on 08/30/2023. He has TOC follow up with Edd Fabian, NP 09/04/23. Please call for TOC follow up.

## 2023-08-30 NOTE — Progress Notes (Signed)
 PROGRESS NOTE    Thomas Seidel Sr.  FMW:[id number] DOB: Sep 05, 1960 DOA: 08/27/2023 PCP: Ozell Heron HERO, MD   Brief Narrative: 63 year old with past medical history significant for asthma presented 1/9 with shortness of breath.  He reports that he developed acute onset of shortness of breath the day prior to admission.  He has history of asthma, used his MDI without improvement.  Found to  have non-STEMI, cardiology following.   Assessment & Plan:   Principal Problem:   Exacerbation of asthma Active Problems:   Moderate persistent asthma   Troponin level elevated   Elevated blood pressure reading   1-Asthma exacerbation: -Presented with shortness of breath, history of moderate persistent asthma. -COVID flu RSV negative -Continue with nebulizer -IV Solu-Medrol  -Continue Dulera  Breathing improving.    2-non-STEMI likely type II demand ischemia , elevated troponin: Non Ischemic Cardiomyopathy  -EKG unremarkable -Cardiology  following -Follow CTA and echo CTA score 2.7 low risk for future cardiac events.  Echo ejection fraction 40 to 45%.  Left ventricle demonstrates regional wall motion abnormality. -Cardiology think mild reduced left ventricular ejection fraction related to  nonischemic cardiomyopathy. Unclear etiology.  Started  Entresto . ---Monitor Cr  AKI; monitor renal function on Entresto .    Estimated body mass index is 22.73 kg/m as calculated from the following:   Height as of this encounter: 5' 8 (1.727 m).   Weight as of this encounter: 67.8 kg.   DVT prophylaxis: Lovenox  Code Status: Full code Family Communication: Care discussed with patient and wife who was at bedside.  Disposition Plan:  Status is: Observation The patient remains OBS appropriate and will d/c before 2 midnights.    Consultants:  Cardiology   Procedures:  none  Antimicrobials:    Subjective: He is breathing better.  Denies cough.   Objective: Vitals:   08/30/23  0800 08/30/23 0850 08/30/23 1117 08/30/23 1308  BP:  103/61  104/64  Pulse: 95 100  97  Resp: 18 (!) 23  (!) 21  Temp:  97.9 F (36.6 C)  97.8 F (36.6 C)  TempSrc:  Oral  Oral  SpO2:  91% 97% 100%  Weight:      Height:        Intake/Output Summary (Last 24 hours) at 08/30/2023 1519 Last data filed at 08/30/2023 1300 Gross per 24 hour  Intake 720 ml  Output 300 ml  Net 420 ml   Filed Weights   08/28/23 1753 08/29/23 0603 08/30/23 0424  Weight: 74.8 kg 68.4 kg 67.8 kg    Examination:  General exam: NAD Respiratory system: BL air movement. Sporadic wheezing.  Cardiovascular system: S 1, S 2 RRR  Gastrointestinal system: BS present, soft,nt Extremities: No edema  Data Reviewed: I have personally reviewed following labs and imaging studies  CBC: Recent Labs  Lab 08/27/23 2042 08/29/23 0236  WBC 5.4 4.2  NEUTROABS  --  3.5  HGB 11.4* 12.0*  HCT 34.3* 35.9*  MCV 98.6 98.1  PLT 203 191   Basic Metabolic Panel: Recent Labs  Lab 08/27/23 2042 08/29/23 0236 08/30/23 0240  NA 133* 133* 135  K 3.5 3.8 4.2  CL 101 101 102  CO2 24 20* 23  GLUCOSE 102* 179* 135*  BUN 10 15 31*  CREATININE 1.12 1.41* 1.50*  CALCIUM 8.4* 9.0 8.8*   GFR: Estimated Creatinine Clearance: 49.6 mL/min (A) (by C-G formula based on SCr of 1.5 mg/dL (H)). Liver Function Tests: No results for input(s): AST, ALT, ALKPHOS, BILITOT, PROT, ALBUMIN in  the last 168 hours. No results for input(s): LIPASE, AMYLASE in the last 168 hours. No results for input(s): AMMONIA in the last 168 hours. Coagulation Profile: No results for input(s): INR, PROTIME in the last 168 hours. Cardiac Enzymes: No results for input(s): CKTOTAL, CKMB, CKMBINDEX, TROPONINI in the last 168 hours. BNP (last 3 results) No results for input(s): PROBNP in the last 8760 hours. HbA1C: Recent Labs    08/28/23 0951  HGBA1C 5.5   CBG: No results for input(s): GLUCAP in the last 168  hours. Lipid Profile: Recent Labs    08/28/23 0810  CHOL 140  HDL 53  LDLCALC 80  TRIG 35  CHOLHDL 2.6   Thyroid Function Tests: Recent Labs    08/28/23 0810 08/30/23 0240  TSH 0.315*  --   FREET4  --  0.95   Anemia Panel: No results for input(s): VITAMINB12, FOLATE, FERRITIN, TIBC, IRON, RETICCTPCT in the last 72 hours. Sepsis Labs: No results for input(s): PROCALCITON, LATICACIDVEN in the last 168 hours.  Recent Results (from the past 240 hours)  Resp panel by RT-PCR (RSV, Flu A&B, Covid) Anterior Nasal Swab     Status: None   Collection Time: 08/27/23  9:19 PM   Specimen: Anterior Nasal Swab  Result Value Ref Range Status   SARS Coronavirus 2 by RT PCR NEGATIVE NEGATIVE Final    Comment: (NOTE) SARS-CoV-2 target nucleic acids are NOT DETECTED.  The SARS-CoV-2 RNA is generally detectable in upper respiratory specimens during the acute phase of infection. The lowest concentration of SARS-CoV-2 viral copies this assay can detect is 138 copies/mL. A negative result does not preclude SARS-Cov-2 infection and should not be used as the sole basis for treatment or other patient management decisions. A negative result may occur with  improper specimen collection/handling, submission of specimen other than nasopharyngeal swab, presence of viral mutation(s) within the areas targeted by this assay, and inadequate number of viral copies(<138 copies/mL). A negative result must be combined with clinical observations, patient history, and epidemiological information. The expected result is Negative.  Fact Sheet for Patients:  bloggercourse.com  Fact Sheet for Healthcare Providers:  seriousbroker.it  This test is no t yet approved or cleared by the [place]  FDA and  has been authorized for detection and/or diagnosis of SARS-CoV-2 by FDA under an Emergency Use Authorization (EUA). This EUA will remain  in  effect (meaning this test can be used) for the duration of the COVID-19 declaration under Section 564(b)(1) of the Act, 21 U.S.C.section 360bbb-3(b)(1), unless the authorization is terminated  or revoked sooner.       Influenza A by PCR NEGATIVE NEGATIVE Final   Influenza B by PCR NEGATIVE NEGATIVE Final    Comment: (NOTE) The Xpert Xpress SARS-CoV-2/FLU/RSV plus assay is intended as an aid in the diagnosis of influenza from Nasopharyngeal swab specimens and should not be used as a sole basis for treatment. Nasal washings and aspirates are unacceptable for Xpert Xpress SARS-CoV-2/FLU/RSV testing.  Fact Sheet for Patients: bloggercourse.com  Fact Sheet for Healthcare Providers: seriousbroker.it  This test is not yet approved or cleared by the [place]  FDA and has been authorized for detection and/or diagnosis of SARS-CoV-2 by FDA under an Emergency Use Authorization (EUA). This EUA will remain in effect (meaning this test can be used) for the duration of the COVID-19 declaration under Section 564(b)(1) of the Act, 21 U.S.C. section 360bbb-3(b)(1), unless the authorization is terminated or revoked.     Resp Syncytial Virus by PCR NEGATIVE  NEGATIVE Final    Comment: (NOTE) Fact Sheet for Patients: bloggercourse.com  Fact Sheet for Healthcare Providers: seriousbroker.it  This test is not yet approved or cleared by the [place]  FDA and has been authorized for detection and/or diagnosis of SARS-CoV-2 by FDA under an Emergency Use Authorization (EUA). This EUA will remain in effect (meaning this test can be used) for the duration of the COVID-19 declaration under Section 564(b)(1) of the Act, 21 U.S.C. section 360bbb-3(b)(1), unless the authorization is terminated or revoked.  Performed at [facility], [address], [place], [place] [address]           Radiology Studies: No results found.       Scheduled Meds:  docusate sodium   100 mg Oral BID   enoxaparin  (LOVENOX ) injection  40 mg Subcutaneous Daily   ipratropium-albuterol   3 mL Nebulization Q6H   methylPREDNISolone  (SOLU-MEDROL ) injection  80 mg Intravenous Q12H   mometasone -formoterol   2 puff Inhalation BID   sacubitril -valsartan   1 tablet Oral BID   sodium chloride  flush  3 mL Intravenous Q12H   Continuous Infusions:   LOS: 0 days    Time spent: 35 minutes    Reathel Turi A Jaylise Peek, MD [facility]   If 7PM-7AM, please contact night-coverage [email or web address]  08/30/2023, 3:19 PM

## 2023-08-30 NOTE — Plan of Care (Signed)

## 2023-08-31 DIAGNOSIS — I5021 Acute systolic (congestive) heart failure: Secondary | ICD-10-CM | POA: Diagnosis not present

## 2023-08-31 DIAGNOSIS — I214 Non-ST elevation (NSTEMI) myocardial infarction: Secondary | ICD-10-CM

## 2023-08-31 LAB — BASIC METABOLIC PANEL WITH GFR
Anion gap: 10 (ref 5–15)
BUN: 29 mg/dL — ABNORMAL HIGH (ref 8–23)
CO2: 22 mmol/L (ref 22–32)
Calcium: 8.8 mg/dL — ABNORMAL LOW (ref 8.9–10.3)
Chloride: 103 mmol/L (ref 98–111)
Creatinine, Ser: 1.26 mg/dL — ABNORMAL HIGH (ref 0.61–1.24)
GFR, Estimated: >60 mL/min
Glucose, Bld: 110 mg/dL — ABNORMAL HIGH (ref 70–99)
Potassium: 4 mmol/L (ref 3.5–5.1)
Sodium: 135 mmol/L (ref 135–145)

## 2023-08-31 LAB — T3, FREE: T3, Free: 2.6 pg/mL (ref 2.0–4.4)

## 2023-08-31 MED ORDER — MOMETASONE FURO-FORMOTEROL FUM 100-5 MCG/ACT IN AERO: 2.0000 | INHALATION_SPRAY | Freq: Two times a day (BID) | RESPIRATORY_TRACT | 0 refills | Status: DC

## 2023-08-31 MED ORDER — ALBUTEROL SULFATE HFA 108 (90 BASE) MCG/ACT IN AERS: 2.0000 | INHALATION_SPRAY | RESPIRATORY_TRACT | 2 refills | Status: DC | PRN

## 2023-08-31 MED ORDER — IPRATROPIUM-ALBUTEROL 0.5-2.5 (3) MG/3ML IN SOLN: 3.0000 mL | Freq: Two times a day (BID) | RESPIRATORY_TRACT | 0 refills | Status: DC

## 2023-08-31 MED ORDER — PREDNISONE 20 MG PO TABS: 40.0000 mg | ORAL_TABLET | Freq: Every day | ORAL | 0 refills | Status: AC

## 2023-08-31 MED ORDER — DOCUSATE SODIUM 100 MG PO CAPS: 100.0000 mg | ORAL_CAPSULE | Freq: Two times a day (BID) | ORAL | 0 refills | Status: AC

## 2023-08-31 MED ORDER — IPRATROPIUM-ALBUTEROL 0.5-2.5 (3) MG/3ML IN SOLN
3.0000 mL | Freq: Two times a day (BID) | RESPIRATORY_TRACT | Status: DC
Start: 2023-08-31 — End: 2023-08-31

## 2023-08-31 MED ORDER — SACUBITRIL-VALSARTAN 24-26 MG PO TABS: 1.0000 | ORAL_TABLET | Freq: Two times a day (BID) | ORAL | 1 refills | Status: DC

## 2023-08-31 NOTE — TOC Transition Note (Addendum)
 Transition of Care [facility]) - Discharge Note   Patient Details  Name: Himmat Enberg Sr. MRN: [id number] Date of Birth: [birth year]  [facility] CM/SW Contact:  Waddell Barnie Rama, RN Phone Number: 08/31/2023, 10:09 AM   Clinical Narrative:    For possible dc today, he has no needs. Wife at bedside to transport him home. [employer] to supply neb machine.   Final next level of care: Home/Self Care Barriers to Discharge: No Barriers Identified   Patient Goals and CMS Choice Patient states their goals for this hospitalization and ongoing recovery are:: return home   Choice offered to / list presented to : NA      Discharge Placement                       Discharge Plan and Services Additional resources added to the After Visit Summary for   In-house Referral: NA Discharge Planning Services: CM Consult Post Acute Care Choice: NA          DME Arranged: N/A DME Agency: NA       HH Arranged: NA          Social Drivers of Health (SDOH) Interventions SDOH Screenings   Food Insecurity: No Food Insecurity (08/28/2023)  Housing: Low Risk  (08/28/2023)  Transportation Needs: No Transportation Needs (08/28/2023)  Utilities: Not At Risk (08/28/2023)  Social Connections: Unknown (12/27/2021)   Received from [facility], [facility]  Tobacco Use: Low Risk  (08/27/2023)     Readmission Risk Interventions     No data to display

## 2023-08-31 NOTE — Plan of Care (Signed)

## 2023-08-31 NOTE — Progress Notes (Signed)
 Progress Note  Patient Name: Thomas Weare Sr. Date of Encounter: 08/31/2023  Primary Cardiologist:   None   Subjective   No pain.   Breathing is almost back to baseline and much improved.   Inpatient Medications    Scheduled Meds:  docusate sodium   100 mg Oral BID   enoxaparin  (LOVENOX ) injection  40 mg Subcutaneous Daily   ipratropium-albuterol   3 mL Nebulization Q6H   methylPREDNISolone  (SOLU-MEDROL ) injection  80 mg Intravenous Q12H   mometasone -formoterol   2 puff Inhalation BID   sacubitril -valsartan   1 tablet Oral BID   sodium chloride  flush  3 mL Intravenous Q12H   Continuous Infusions:  PRN Meds: acetaminophen  **OR** acetaminophen , albuterol , bisacodyl , hydrALAZINE , morphine  injection, ondansetron  **OR** ondansetron  (ZOFRAN ) IV, oxyCODONE , polyethylene glycol   Vital Signs    Vitals:   08/30/23 2016 08/31/23 0200 08/31/23 0210 08/31/23 0456  BP: 124/63  120/66 120/61  Pulse: 87  89 87  Resp: (!) 21  16 15   Temp: (!) 97.4 F (36.3 C)  (!) 97.3 F (36.3 C) (!) 97.3 F (36.3 C)  TempSrc: Oral  Oral Oral  SpO2: 98% 98% 100% 96%  Weight:    70.8 kg  Height:        Intake/Output Summary (Last 24 hours) at 08/31/2023 0754 Last data filed at 08/30/2023 1700 Gross per 24 hour  Intake 480 ml  Output 300 ml  Net 180 ml   Filed Weights   08/29/23 0603 08/30/23 0424 08/31/23 0456  Weight: 68.4 kg 67.8 kg 70.8 kg    Telemetry    NSR - Personally Reviewed  ECG    NA - Personally Reviewed  Physical Exam   GEN: No  acute distress.   Neck: No  JVD Cardiac: RRR, no murmurs, rubs, or gallops.  Respiratory:    Few expiratory wheezes.  GI: Soft, nontender, non-distended, normal bowel sounds  MS:  No edema; No deformity. Neuro:   Nonfocal  Psych: Oriented and appropriate     Labs    Chemistry Recent Labs  Lab 08/27/23 2042 08/29/23 0236 08/30/23 0240  NA 133* 133* 135  K 3.5 3.8 4.2  CL 101 101 102  CO2 24 20* 23  GLUCOSE 102* 179*  135*  BUN 10 15 31*  CREATININE 1.12 1.41* 1.50*  CALCIUM 8.4* 9.0 8.8*  GFRNONAA >60 57* 53*  ANIONGAP 8 12 10      Hematology Recent Labs  Lab 08/27/23 2042 08/29/23 0236  WBC 5.4 4.2  RBC 3.48* 3.66*  HGB 11.4* 12.0*  HCT 34.3* 35.9*  MCV 98.6 98.1  MCH 32.8 32.8  MCHC 33.2 33.4  RDW 13.2 13.1  PLT 203 191    Cardiac EnzymesNo results for input(s): TROPONINI in the last 168 hours. No results for input(s): TROPIPOC in the last 168 hours.   BNP Recent Labs  Lab 08/29/23 1408  BNP 85.5     DDimer No results for input(s): DDIMER in the last 168 hours.   Radiology    No results found.   Cardiac Studies   Echocardiogram 08/28/2023   1. Left ventricular ejection fraction, by estimation, is 40 to 45%. The  left ventricle has mildly decreased function. The left ventricle  demonstrates regional wall motion abnormalities (see scoring  diagram/findings for description). Left ventricular  diastolic parameters are consistent with Grade I diastolic dysfunction  (impaired relaxation).   2. Right ventricular systolic function is normal. The right ventricular  size is normal.   3. The mitral  valve is normal in structure. No evidence of mitral valve  regurgitation. No evidence of mitral stenosis.   4. The aortic valve is tricuspid. There is mild calcification of the  aortic valve. Aortic valve regurgitation is not visualized. No aortic  stenosis is present.   5. The inferior vena cava is normal in size with greater than 50%  respiratory variability, suggesting right atrial pressure of 3 mmHg.   Patient Profile     63 y.o. male with a hx of asthma who is being seen 08/28/2023 for the evaluation of chest pain, increased cough at the request of Dr. Armenta at [facility]   Assessment & Plan    Elevated troponin:   Coronary CT with minimal coronary plaque.   Continue with primary risk reduction.  Cardiomyopathy: Nonischemic.  Etiology is not clear.   I started low dose  Entresto  two days ago.  Follow closely.  Creat is down today.     AKI: His creatinine is down.   Avoid beta blocker.   Decreased TSH:  T3/T4 OK.    Disposition:  Home when OK with primary team.  We have follow scheduled for him later this week.  We can titrate meds and follow his creat.    For questions or updates, please contact [facility] Please consult [email or web address] for contact info under Cardiology/STEMI.   Signed, Lynwood Schilling, MD  08/31/2023, 7:54 AM

## 2023-08-31 NOTE — Progress Notes (Signed)
 Heart Failure Navigator Progress Note  Assessed for Heart & Vascular TOC clinic readiness.  Patient.has a scheduled  - [facility] appointment on 1/17 , No TOC   Navigator will sign off at this time.   Stephane Haddock, BSN, Scientist, Clinical (histocompatibility And Immunogenetics) Only

## 2023-08-31 NOTE — TOC Initial Note (Signed)
 Transition of Care (TOC) - Initial/Assessment Note    Patient Details  Name: Thomas Albarran Sr. MRN: [id number] Date of Birth: 03-05-1961  Transition of Care [facility]) CM/SW Contact:    Waddell Barnie Rama, RN Phone Number: 08/31/2023, 10:08 AM  Clinical Narrative:                 From home with spouse, has PCP and insurance on file, states has no HH services in place at this time or DME at home.  States family member will transport them home at [employer] and family is support system, states [facility] [address] in [place]. Pta self ambulatory .  Expected Discharge Plan: Home/Self Care Barriers to Discharge: No Barriers Identified   Patient Goals and CMS Choice Patient states their goals for this hospitalization and ongoing recovery are:: return home   Choice offered to / list presented to : NA      Expected Discharge Plan and Services In-house Referral: NA Discharge Planning Services: CM Consult Post Acute Care Choice: NA Living arrangements for the past 2 months: Single Family Home                 DME Arranged: N/A DME Agency: NA       HH Arranged: NA          Prior Living Arrangements/Services Living arrangements for the past 2 months: Single Family Home Lives with:: Spouse Patient language and need for interpreter reviewed:: Yes Do you feel safe going back to the place where you live?: Yes      Need for Family Participation in Patient Care: Yes (Comment) Care giver support system in place?: Yes (comment)   Criminal Activity/Legal Involvement Pertinent to Current Situation/Hospitalization: No - Comment as needed  Activities of Daily Living   ADL Screening (condition at time of admission) Independently performs ADLs?: Yes (appropriate for developmental age) Is the patient deaf or have difficulty hearing?: No Does the patient have difficulty seeing, even when wearing glasses/contacts?: No Does the patient have difficulty concentrating,  remembering, or making decisions?: No  Permission Sought/Granted Permission sought to share information with : Case Manager Permission granted to share information with : Yes, Verbal Permission Granted              Emotional Assessment Appearance:: Appears stated age Attitude/Demeanor/Rapport: Engaged Affect (typically observed): Appropriate Orientation: : Oriented to Self, Oriented to Place, Oriented to  Time, Oriented to Situation Alcohol / Substance Use: Not Applicable Psych Involvement: No (comment)  Admission diagnosis:  Shortness of breath [R06.02] ACS (acute coronary syndrome) (HCC) [I24.9] Moderate persistent asthma with exacerbation [J45.41] NSTEMI (non-ST elevated myocardial infarction) (HCC) [I21.4] Patient Active Problem List   Diagnosis Date Noted   ACS (acute coronary syndrome) (HCC) 08/28/2023   Shortness of breath 08/28/2023   Non-ST elevation (NSTEMI) myocardial infarction (HCC) 08/28/2023   Demand ischemia (HCC) 08/28/2023   Precordial pain 08/28/2023   Troponin level elevated 08/28/2023   Acute cough 08/28/2023   Exacerbation of asthma 08/28/2023   Elevated blood pressure reading 08/28/2023   Moderate persistent asthma 10/14/2022   Acute bacterial conjunctivitis of both eyes 10/14/2022   Anemia 10/14/2022   Urticarial rash 12/15/2012   Oral thrush 08/30/2012   Asthma 07/28/2012   PCP:  Ozell Heron HERO, MD Pharmacy:   [facility] [address] [place] [facility] [address] [place] [address] Phone: [phone] Fax: [phone]     Social Drivers of Health (SDOH) Social History: SDOH  Screenings   Food Insecurity: No Food Insecurity (08/28/2023)  Housing: Low Risk  (08/28/2023)  Transportation Needs: No Transportation Needs (08/28/2023)  Utilities: Not At Risk (08/28/2023)  Social Connections: Unknown (12/27/2021)   Received from [facility], [facility]  Tobacco Use: Low Risk  (08/27/2023)   SDOH  Interventions:     Readmission Risk Interventions     No data to display

## 2023-08-31 NOTE — Discharge Summary (Signed)
 Physician Discharge Summary   Patient: Thomas [name]. MRN: [id number] DOB: 05-Apr-1961  Admit date:     08/27/2023  Discharge date: 08/31/23  Discharge Physician: Owen DELENA Lore   PCP: Ozell Heron HERO, MD   Recommendations at discharge:     Discharge Diagnoses: Principal Problem:   Exacerbation of asthma Active Problems:   Moderate persistent asthma   Shortness of breath   Troponin level elevated   Elevated blood pressure reading  Resolved Problems:   * No resolved hospital problems. [facility] Course: 63 year old with past medical history significant for asthma presented 1/9 with shortness of breath.  He reports that he developed acute onset of shortness of breath the day prior to admission.  He has history of asthma, used his MDI without improvement.  Found to  have non-STEMI, cardiology following.    Assessment and Plan: 1-Asthma exacerbation: -Presented with shortness of breath, history of moderate persistent asthma. -COVID flu RSV negative -Continue with nebulizer -Treated with IV Solu-Medrol  -Continue Dulera  Breathing improving.   discharge on prednisone  for 4 days.     2-non-STEMI likely type II demand ischemia , elevated troponin: Non Ischemic Cardiomyopathy  -EKG unremarkable -Cardiology  following -Follow CTA and echo CTA score 2.7 low risk for future cardiac events.  Echo ejection fraction 40 to 45%.  Left ventricle demonstrates regional wall motion abnormality. -Cardiology think mild reduced left ventricular ejection fraction related to  nonischemic cardiomyopathy. Unclear etiology.  Started  Entresto . ---Monitor Cr  needs follow up with cardiology   AKI; monitor renal function on Entresto .   cr down to 1.2. stable for discharge          Consultants: Cardiology  Procedures performed: none  Disposition: Home Diet recommendation:  Discharge Diet Orders (From admission, onward)     Start     Ordered   08/31/23 0000  Diet - low  sodium heart healthy        08/31/23 1252           Cardiac diet DISCHARGE MEDICATION: Allergies as of 08/31/2023   No Known Allergies      Medication List     TAKE these medications    albuterol  108 (90 Base) MCG/ACT inhaler Commonly known as: Proventil  HFA Inhale 2 puffs into the lungs every 4 (four) hours as needed for wheezing or shortness of breath.   docusate sodium  100 MG capsule Commonly known as: COLACE Take 1 capsule (100 mg total) by mouth 2 (two) times daily.   ipratropium-albuterol  0.5-2.5 (3) MG/3ML Soln Commonly known as: DUONEB Take 3 mLs by nebulization 2 (two) times daily.   mometasone -formoterol  100-5 MCG/ACT Aero Commonly known as: DULERA  Inhale 2 puffs into the lungs 2 (two) times daily. What changed: when to take this   predniSONE  20 MG tablet Commonly known as: DELTASONE  Take 2 tablets (40 mg total) by mouth daily for 5 days.   sacubitril -valsartan  24-26 MG Commonly known as: ENTRESTO  Take 1 tablet by mouth 2 (two) times daily.               Durable Medical Equipment  (From admission, onward)           Start     Ordered   08/31/23 1253  For home use only DME Nebulizer machine  Once       Question Answer Comment  Patient needs a nebulizer to treat with the following condition Asthma   Length of Need Lifetime   Additional equipment included Administration kit  08/31/23 1252            Follow-up Information     Emelia Josefa HERO, NP Follow up on 09/04/2023.   Specialty: Cardiology Why: Hospital Follow Up appointment at 8:50 AM. Please arrive 15 mins early. We will get blood work this day (BMET). Contact information: [address] [place] [address] [phone]                Discharge Exam: Fredricka Weights   08/29/23 0603 08/30/23 0424 08/31/23 0456  Weight: 68.4 kg 67.8 kg 70.8 kg   General; NAD  Condition at discharge: stable  The results of significant diagnostics from this  hospitalization (including imaging, microbiology, ancillary and laboratory) are listed below for reference.   Imaging Studies: CT CORONARY MORPH W/CTA COR W/SCORE W/CA W/CM &/OR WO/CM Addendum Date: 08/28/2023 ADDENDUM REPORT: 08/28/2023 12:22 ADDENDUM: There is prior CT angiogram of the chest complete from 08/27/2023. Please correlate with that examination. Otherwise along the visualized lung bases there are areas of bronchial wall thickening along both lower lobes. The visualized portions of the lungs are without consolidation, pneumothorax or effusion. Mild debris along the right main bronchus. Visualized mediastinum is without lymph node enlargement. A few small less than 1 cm size short axis subcarinal and left hilar nodes are seen, nonpathologic by size criteria. The upper abdomen is barely included in the imaging field. Only the top of the liver and spleen are included and are grossly preserved on this limited exam. Degenerative changes are seen along the spine. Electronically Signed   By: Ranell Bring M.D.   On: 08/28/2023 12:22   Result Date: 08/28/2023 CLINICAL DATA:  Chest pain EXAM: Cardiac CTA MEDICATIONS: Sub lingual nitro. 4mg  x 2 TECHNIQUE: The patient was scanned on a Siemens 192 slice scanner. Gantry rotation speed was 250 msecs. Collimation was 0.6 mm. A 100 kV prospective scan was triggered in the ascending thoracic aorta at 35-75% of the R-R interval. Average HR during the scan was 60 bpm. The 3D data set was interpreted on a dedicated work station using MPR, MIP and VRT modes. A total of 80cc of contrast was used. FINDINGS: Non-cardiac: See separate report from [facility]. Pulmonary veins drain normally to the left atrium. No LA appendage thrombus. Calcium Score: 2.74 Agatston units. Coronary Arteries: Right dominant with no anomalies LM: No plaque or stenosis. LAD system:  No plaque or stenosis. Circumflex system: Calcified plaque ostial LCx, mild (1-24%) stenosis. RCA system:  No plaque or stenosis. IMPRESSION: 1. Coronary artery calcium score 2.74 Agatston units. This places the patient in the 2nd percentile for age and gender, suggesting low risk for future cardiac events. 2.  Minimal coronary disease. [name] Electronically Signed: By: Ezra Shuck M.D. On: 08/28/2023 12:06   ECHOCARDIOGRAM COMPLETE Result Date: 08/28/2023    ECHOCARDIOGRAM REPORT   Patient Name:   Horton Ellithorpe [name]. Date of Exam: 08/28/2023 Medical Rec #:  [id number]             Height:       68.0 in Accession #:    [id number]            Weight:       164.9 lb Date of Birth:  09-Sep-1960             BSA:          1.883 m Patient Age:    63 years  BP:           135/82 mmHg Patient Gender: M                     HR:           65 bpm. Exam Location:  Inpatient Procedure: 2D Echo, Cardiac Doppler, Color Doppler and Intracardiac            Opacification Agent Indications:    R07.9* Chest pain, unspecified  History:        Patient has no prior history of Echocardiogram examinations.                 NSTEMI; Signs/Symptoms:Shortness of Breath and Chest Pain.  Sonographer:    Lanell Maduro Referring Phys: [id number] ARTIST POUCH IMPRESSIONS  1. Left ventricular ejection fraction, by estimation, is 40 to 45%. The left ventricle has mildly decreased function. The left ventricle demonstrates regional wall motion abnormalities (see scoring diagram/findings for description). Left ventricular diastolic parameters are consistent with Grade I diastolic dysfunction (impaired relaxation).  2. Right ventricular systolic function is normal. The right ventricular size is normal.  3. The mitral valve is normal in structure. No evidence of mitral valve regurgitation. No evidence of mitral stenosis.  4. The aortic valve is tricuspid. There is mild calcification of the aortic valve. Aortic valve regurgitation is not visualized. No aortic stenosis is present.  5. The inferior vena cava is normal in size with greater than  50% respiratory variability, suggesting right atrial pressure of 3 mmHg. FINDINGS  Left Ventricle: Left ventricular ejection fraction, by estimation, is 40 to 45%. The left ventricle has mildly decreased function. The left ventricle demonstrates regional wall motion abnormalities. The left ventricular internal cavity size was normal in size. There is no left ventricular hypertrophy. Left ventricular diastolic parameters are consistent with Grade I diastolic dysfunction (impaired relaxation).  LV Wall Scoring: The mid and distal anterior septum, mid inferoseptal segment, and apical inferior segment are hypokinetic. Right Ventricle: The right ventricular size is normal. No increase in right ventricular wall thickness. Right ventricular systolic function is normal. Left Atrium: Left atrial size was normal in size. Right Atrium: Right atrial size was normal in size. Pericardium: There is no evidence of pericardial effusion. Mitral Valve: The mitral valve is normal in structure. No evidence of mitral valve regurgitation. No evidence of mitral valve stenosis. Tricuspid Valve: The tricuspid valve is normal in structure. Tricuspid valve regurgitation is trivial. No evidence of tricuspid stenosis. Aortic Valve: The aortic valve is tricuspid. There is mild calcification of the aortic valve. Aortic valve regurgitation is not visualized. No aortic stenosis is present. Pulmonic Valve: The pulmonic valve was normal in structure. Pulmonic valve regurgitation is not visualized. No evidence of pulmonic stenosis. Aorta: The aortic root is normal in size and structure. Venous: The inferior vena cava is normal in size with greater than 50% respiratory variability, suggesting right atrial pressure of 3 mmHg. IAS/Shunts: No atrial level shunt detected by color flow Doppler.  LEFT VENTRICLE PLAX 2D LVIDd:         3.80 cm      Diastology LVIDs:         2.10 cm      LV e' medial:    6.37 cm/s LV PW:         0.90 cm      LV E/e' medial:  9.4  LV IVS:        0.90 cm  LV e' lateral:   9.64 cm/s LVOT diam:     2.10 cm      LV E/e' lateral: 6.2 LV SV:         46 LV SV Index:   24 LVOT Area:     3.46 cm  LV Volumes (MOD) LV vol d, MOD A2C: 100.0 ml LV vol d, MOD A4C: 91.8 ml LV vol s, MOD A2C: 47.7 ml LV vol s, MOD A4C: 51.5 ml LV SV MOD A2C:     52.3 ml LV SV MOD A4C:     91.8 ml LV SV MOD BP:      45.4 ml RIGHT VENTRICLE            IVC RV Basal diam:  3.00 cm    IVC diam: 1.90 cm RV S prime:     8.86 cm/s TAPSE (M-mode): 2.3 cm LEFT ATRIUM           Index       RIGHT ATRIUM          Index LA diam:      2.90 cm 1.54 cm/m  RA Area:     9.05 cm LA Vol (A2C): 16.9 ml 8.98 ml/m  RA Volume:   16.70 ml 8.87 ml/m LA Vol (A4C): 16.6 ml 8.82 ml/m  AORTIC VALVE LVOT Vmax:   72.50 cm/s LVOT Vmean:  43.700 cm/s LVOT VTI:    0.133 m  AORTA Ao Root diam: 2.80 cm MITRAL VALVE               TRICUSPID VALVE MV Area (PHT): 2.87 cm    TR Peak grad:   24.2 mmHg MV Decel Time: 264 msec    TR Vmax:        246.00 cm/s MV E velocity: 59.60 cm/s MV A velocity: 60.70 cm/s  SHUNTS MV E/A ratio:  0.98        Systemic VTI:  0.13 m                            Systemic Diam: 2.10 cm Toribio Fuel MD Electronically signed by Toribio Fuel MD Signature Date/Time: 08/28/2023/12:16:15 PM    Final    CT Angio Chest PE W and/or Wo Contrast Result Date: 08/27/2023 CLINICAL DATA:  Shortness of breath and cough, chest tightness EXAM: CT ANGIOGRAPHY CHEST WITH CONTRAST TECHNIQUE: Multidetector CT imaging of the chest was performed using the standard protocol during bolus administration of intravenous contrast. Multiplanar CT image reconstructions and MIPs were obtained to evaluate the vascular anatomy. RADIATION DOSE REDUCTION: This exam was performed according to the departmental dose-optimization program which includes automated exposure control, adjustment of the mA and/or kV according to patient size and/or use of iterative reconstruction technique. CONTRAST:  75mL OMNIPAQUE   IOHEXOL  350 MG/ML SOLN COMPARISON:  Radiographs earlier today and CTA chest 04/23/2021 FINDINGS: Cardiovascular: Negative for acute pulmonary embolism. Normal caliber thoracic aorta. No pericardial effusion. Mediastinum/Nodes: Trachea and esophagus are unremarkable. No thoracic adenopathy Lungs/Pleura: No focal consolidation, pleural effusion, or pneumothorax. Similar mild bronchial wall thickening compared to 04/23/2021. Upper Abdomen: No acute abnormality. Musculoskeletal: No acute fracture. Review of the MIP images confirms the above findings. IMPRESSION: Negative for acute pulmonary embolism. Mild bronchitis/reactive airways. Electronically Signed   By: Norman Gatlin M.D.   On: 08/27/2023 22:28   DG Chest 2 View Result Date: 08/27/2023 CLINICAL DATA:  Chest pain and shortness of breath EXAM: CHEST - 2 VIEW  COMPARISON:  05/07/2022 FINDINGS: The heart size and mediastinal contours are within normal limits. Both lungs are clear. The visualized skeletal structures are unremarkable. IMPRESSION: No active cardiopulmonary disease. Electronically Signed   By: Oneil Devonshire M.D.   On: 08/27/2023 20:57    Microbiology: Results for orders placed or performed during the hospital encounter of 08/27/23  Resp panel by RT-PCR (RSV, Flu A&B, Covid) Anterior Nasal Swab     Status: None   Collection Time: 08/27/23  9:19 PM   Specimen: Anterior Nasal Swab  Result Value Ref Range Status   SARS Coronavirus 2 by RT PCR NEGATIVE NEGATIVE Final    Comment: (NOTE) SARS-CoV-2 target nucleic acids are NOT DETECTED.  The SARS-CoV-2 RNA is generally detectable in upper respiratory specimens during the acute phase of infection. The lowest concentration of SARS-CoV-2 viral copies this assay can detect is 138 copies/mL. A negative result does not preclude SARS-Cov-2 infection and should not be used as the sole basis for treatment or other patient management decisions. A negative result may occur with  improper specimen  collection/handling, submission of specimen other than nasopharyngeal swab, presence of viral mutation(s) within the areas targeted by this assay, and inadequate number of viral copies(<138 copies/mL). A negative result must be combined with clinical observations, patient history, and epidemiological information. The expected result is Negative.  Fact Sheet for Patients:  bloggercourse.com  Fact Sheet for Healthcare Providers:  seriousbroker.it  This test is no t yet approved or cleared by the [place]  FDA and  has been authorized for detection and/or diagnosis of SARS-CoV-2 by FDA under an Emergency Use Authorization (EUA). This EUA will remain  in effect (meaning this test can be used) for the duration of the COVID-19 declaration under Section 564(b)(1) of the Act, 21 U.S.C.section 360bbb-3(b)(1), unless the authorization is terminated  or revoked sooner.       Influenza A by PCR NEGATIVE NEGATIVE Final   Influenza B by PCR NEGATIVE NEGATIVE Final    Comment: (NOTE) The Xpert Xpress SARS-CoV-2/FLU/RSV plus assay is intended as an aid in the diagnosis of influenza from Nasopharyngeal swab specimens and should not be used as a sole basis for treatment. Nasal washings and aspirates are unacceptable for Xpert Xpress SARS-CoV-2/FLU/RSV testing.  Fact Sheet for Patients: bloggercourse.com  Fact Sheet for Healthcare Providers: seriousbroker.it  This test is not yet approved or cleared by the [place]  FDA and has been authorized for detection and/or diagnosis of SARS-CoV-2 by FDA under an Emergency Use Authorization (EUA). This EUA will remain in effect (meaning this test can be used) for the duration of the COVID-19 declaration under Section 564(b)(1) of the Act, 21 U.S.C. section 360bbb-3(b)(1), unless the authorization is terminated or revoked.     Resp Syncytial  Virus by PCR NEGATIVE NEGATIVE Final    Comment: (NOTE) Fact Sheet for Patients: bloggercourse.com  Fact Sheet for Healthcare Providers: seriousbroker.it  This test is not yet approved or cleared by the [place]  FDA and has been authorized for detection and/or diagnosis of SARS-CoV-2 by FDA under an Emergency Use Authorization (EUA). This EUA will remain in effect (meaning this test can be used) for the duration of the COVID-19 declaration under Section 564(b)(1) of the Act, 21 U.S.C. section 360bbb-3(b)(1), unless the authorization is terminated or revoked.  Performed at [facility], [address] [place], [place] [address]     Labs: CBC: Recent Labs  Lab 08/27/23 2042 08/29/23 0236  WBC 5.4 4.2  NEUTROABS  --  3.5  HGB 11.4* 12.0*  HCT 34.3* 35.9*  MCV 98.6 98.1  PLT 203 191   Basic Metabolic Panel: Recent Labs  Lab 08/27/23 2042 08/29/23 0236 08/30/23 0240 08/31/23 0834  NA 133* 133* 135 135  K 3.5 3.8 4.2 4.0  CL 101 101 102 103  CO2 24 20* 23 22  GLUCOSE 102* 179* 135* 110*  BUN 10 15 31* 29*  CREATININE 1.12 1.41* 1.50* 1.26*  CALCIUM 8.4* 9.0 8.8* 8.8*   Liver Function Tests: No results for input(s): AST, ALT, ALKPHOS, BILITOT, PROT, ALBUMIN in the last 168 hours. CBG: No results for input(s): GLUCAP in the last 168 hours.  Discharge time spent: greater than 30 minutes.  Signed: Owen DELENA Lore, MD [facility] 08/31/2023

## 2023-08-31 NOTE — Telephone Encounter (Signed)
 Called the patient and states that he is still at the hospital at this time.

## 2023-09-01 ENCOUNTER — Telehealth: Payer: Self-pay

## 2023-09-01 NOTE — Transitions of Care (Post Inpatient/ED Visit) (Signed)
   09/01/2023  Name: Thomas Hankerson Thurner Sr. MRN: [id number] DOB: [birth year]  Today's TOC FU Call Status: Today's TOC FU Call Status:: Unsuccessful Call (1st Attempt) Unsuccessful Call (1st Attempt) Date: 09/01/23  Attempted to reach the patient regarding the most recent Inpatient/ED visit.  Follow Up Plan: Additional outreach attempts will be made to reach the patient to complete the Transitions of Care (Post Inpatient/ED visit) call.   Signature Julian Lemmings, LPN [facility] Nurse Health Advisor Direct Dial [phone]

## 2023-09-01 NOTE — Telephone Encounter (Signed)
 Attempted to call patient, no answer left message requesting a call back.

## 2023-09-01 NOTE — Progress Notes (Deleted)
Cardiology Clinic Note   Patient Name: Thomas Ragain Sr. Date of Encounter: 09/01/2023  Primary Care Provider:  Karie Georges, MD Primary Cardiologist:  None  Patient Profile    Thomas Petrin Sr. 64 year old male presents the clinic today for follow-up evaluation of his coronary artery disease.  Past Medical History    Past Medical History:  Diagnosis Date   Asthma    Past Surgical History:  Procedure Laterality Date   KNEE ARTHROSCOPY Right    2015   NO PAST SURGERIES      Allergies  No Known Allergies  History of Present Illness    Thomas Strawderman Sr. has a PMH of NSTEMI, demand ischemia, asthma, anemia, acute cough, and elevated blood pressure readings.  His coronary CT showed minimal coronary plaque.  He was admitted to the hospital on 08/28/2023 and discharged on 08/31/2023.  He reported shortness of breath and a significant history of asthma.  The day prior to admission he noted an acute onset of increased shortness of breath.  He used his inhaler and did not have improvement.  He was noted to have NSTEMI.  Cardiology was consulted.  His EKG was unremarkable.  It was felt that his NSTEMI, elevated troponins were related to demand ischemia.  Echocardiogram showed an LVEF of 40-45%.  He was noted to have mildly reduced left ventricular ejection fraction.  He was felt to have nonischemic cardiomyopathy.  He was started on Entresto and instructed to follow-up with cardiology as an outpatient.  He presents to the clinic today for follow-up evaluation and states***. TOC visit***  *** denies chest pain, shortness of breath, lower extremity edema, fatigue, palpitations, melena, hematuria, hemoptysis, diaphoresis, weakness, presyncope, syncope, orthopnea, and PND.  Elevated troponin-denies episodes of chest discomfort since being discharged from the hospital.  Coronary CTA showed minimal coronary plaque.  Echocardiogram showed LVEF of 40-45%.  Troponins were  felt to be elevated in the setting of demand ischemia. Increase physical activity as tolerated Heart healthy low-sodium diet No plans for ischemic evaluation  Cardiomyopathy-slowly increasing physical activity.  No increased DOE or shortness of breath.  Echocardiogram showed LVEF of 40-45% and regional wall motion abnormalities.  Tolerating Entresto well. Heart healthy low-sodium diet Daily weights Continue Entresto Start Farxiga 10 mg daily Start carvedilol 3.125 mg twice daily Plan for repeat echocardiogram once GDMT has been optimized x 1 month. Repeat BMP  Elevated blood pressure readings-BP today***. Maintain blood pressure log Heart healthy low-sodium diet Continue Entresto    Disposition: Follow-up with Dr. Antoine Poche or me in 1-2 months.  Home Medications    Prior to Admission medications   Medication Sig Start Date End Date Taking? Authorizing Provider  albuterol (PROVENTIL HFA) 108 (90 Base) MCG/ACT inhaler Inhale 2 puffs into the lungs every 4 (four) hours as needed for wheezing or shortness of breath. 08/31/23   Regalado, Belkys A, MD  docusate sodium (COLACE) 100 MG capsule Take 1 capsule (100 mg total) by mouth 2 (two) times daily. 08/31/23   Regalado, Belkys A, MD  ipratropium-albuterol (DUONEB) 0.5-2.5 (3) MG/3ML SOLN Take 3 mLs by nebulization 2 (two) times daily. 08/31/23   Regalado, Belkys A, MD  mometasone-formoterol (DULERA) 100-5 MCG/ACT AERO Inhale 2 puffs into the lungs 2 (two) times daily. 08/31/23   Regalado, Belkys A, MD  predniSONE (DELTASONE) 20 MG tablet Take 2 tablets (40 mg total) by mouth daily for 5 days. 08/31/23 09/05/23  Regalado, Prentiss Bells, MD  sacubitril-valsartan (ENTRESTO) 24-26  MG Take 1 tablet by mouth 2 (two) times daily. 08/31/23   Regalado, Prentiss Bells, MD    Family History    No family history on file. He indicated that his mother is deceased. He indicated that his father is deceased.  Social History    Social History   Socioeconomic  History   Marital status: Married    Spouse name: Not on file   Number of children: Not on file   Years of education: Not on file   Highest education level: Not on file  Occupational History   Occupation: truck driver  Tobacco Use   Smoking status: Never   Smokeless tobacco: Never  Vaping Use   Vaping status: Never Used  Substance and Sexual Activity   Alcohol use: No   Drug use: No   Sexual activity: Yes  Other Topics Concern   Not on file  Social History Narrative   Not on file   Social Drivers of Health   Financial Resource Strain: Not on file  Food Insecurity: No Food Insecurity (08/28/2023)   Hunger Vital Sign    Worried About Running Out of Food in the Last Year: Never true    Ran Out of Food in the Last Year: Never true  Transportation Needs: No Transportation Needs (08/28/2023)   PRAPARE - Administrator, Civil Service (Medical): No    Lack of Transportation (Non-Medical): No  Physical Activity: Not on file  Stress: Not on file  Social Connections: Unknown (12/27/2021)   Received from [facility], [facility]   Social Network    Social Network: Not on file  Intimate Partner Violence: Not At Risk (08/28/2023)   Humiliation, Afraid, Rape, and Kick questionnaire    Fear of Current or Ex-Partner: No    Emotionally Abused: No    Physically Abused: No    Sexually Abused: No     Review of Systems    General:  No chills, fever, night sweats or weight changes.  Cardiovascular:  No chest pain, dyspnea on exertion, edema, orthopnea, palpitations, paroxysmal nocturnal dyspnea. Dermatological: No rash, lesions/masses Respiratory: No cough, dyspnea Urologic: No hematuria, dysuria Abdominal:   No nausea, vomiting, diarrhea, bright red blood per rectum, melena, or hematemesis Neurologic:  No visual changes, wkns, changes in mental status. All other systems reviewed and are otherwise negative except as noted above.  Physical Exam    VS:  There were no  vitals taken for this visit. , BMI There is no height or weight on file to calculate BMI. GEN: Well nourished, well developed, in no acute distress. HEENT: normal. Neck: Supple, no JVD, carotid bruits, or masses. Cardiac: RRR, no murmurs, rubs, or gallops. No clubbing, cyanosis, edema.  Radials/DP/PT 2+ and equal bilaterally.  Respiratory:  Respirations regular and unlabored, clear to auscultation bilaterally. GI: Soft, nontender, nondistended, BS + x 4. MS: no deformity or atrophy. Skin: warm and dry, no rash. Neuro:  Strength and sensation are intact. Psych: Normal affect.  Accessory Clinical Findings    Recent Labs: 10/13/2022: ALT 20 08/28/2023: TSH 0.315 08/29/2023: B Natriuretic Peptide 85.5; Hemoglobin 12.0; Platelets 191 08/31/2023: BUN 29; Creatinine, Ser 1.26; Potassium 4.0; Sodium 135   Recent Lipid Panel    Component Value Date/Time   CHOL 140 08/28/2023 0810   TRIG 35 08/28/2023 0810   HDL 53 08/28/2023 0810   CHOLHDL 2.6 08/28/2023 0810   VLDL 7 08/28/2023 0810   LDLCALC 80 08/28/2023 0810  ECG personally reviewed by me today- ***     Echocardiogram 08/28/2023   1. Left ventricular ejection fraction, by estimation, is 40 to 45%. The  left ventricle has mildly decreased function. The left ventricle  demonstrates regional wall motion abnormalities (see scoring  diagram/findings for description). Left ventricular  diastolic parameters are consistent with Grade I diastolic dysfunction  (impaired relaxation).   2. Right ventricular systolic function is normal. The right ventricular  size is normal.   3. The mitral valve is normal in structure. No evidence of mitral valve  regurgitation. No evidence of mitral stenosis.   4. The aortic valve is tricuspid. There is mild calcification of the  aortic valve. Aortic valve regurgitation is not visualized. No aortic  stenosis is present.   5. The inferior vena cava is normal in size with greater than 50%   respiratory variability, suggesting right atrial pressure of 3 mmHg.      Assessment & Plan   1.  ***   Thomas [name]. Aundrey Elahi NP-C     09/01/2023, 8:00 AM [facility] [address] Office [phone] Fax [phone]    I spent***minutes examining this patient, reviewing medications, and using patient centered shared decision making involving their cardiac care.   I spent greater than 20 minutes reviewing their past medical history,  medications, and prior cardiac tests.

## 2023-09-02 NOTE — Telephone Encounter (Signed)
 Called patient will no answer. Left VM to call back.

## 2023-09-02 NOTE — Transitions of Care (Post Inpatient/ED Visit) (Signed)
   09/02/2023  Name: Thomas Handyside Delman Sr. MRN: [id number] DOB: 1961-05-16  Today's TOC FU Call Status: Today's TOC FU Call Status:: Successful TOC FU Call Completed Unsuccessful Call (1st Attempt) Date: 09/01/23 [facility] FU Call Complete Date: 09/02/23 Patient's Name and Date of Birth confirmed.  Transition Care Management Follow-up Telephone Call Date of Discharge: 08/31/23 Discharge Facility: [facility]) Type of Discharge: Inpatient Admission Primary Inpatient Discharge Diagnosis:: NSTemi How have you been since you were released from the hospital?: Better Any questions or concerns?: No  Items Reviewed: Did you receive and understand the discharge instructions provided?: Yes Medications obtained,verified, and reconciled?: Yes (Medications Reviewed) Any new allergies since your discharge?: No Dietary orders reviewed?: Yes Do you have support at home?: Yes People in Home: child(ren), adult, spouse  Medications Reviewed Today: Medications Reviewed Today     Reviewed by Darrall Ellison, LPN (Licensed Practical Nurse) on 09/02/23 at 1501  Med List Status: <None>   Medication Order Taking? Sig Documenting Provider Last Dose Status Informant  albuterol  (PROVENTIL  HFA) 108 (90 Base) MCG/ACT inhaler [id number]  Inhale 2 puffs into the lungs every 4 (four) hours as needed for wheezing or shortness of breath. Regalado, Belkys A, MD  Active   docusate sodium  (COLACE) 100 MG capsule [id number]  Take 1 capsule (100 mg total) by mouth 2 (two) times daily. Regalado, Belkys A, MD  Active   ipratropium-albuterol  (DUONEB) 0.5-2.5 (3) MG/3ML SOLN [id number]  Take 3 mLs by nebulization 2 (two) times daily. Regalado, Belkys A, MD  Active   mometasone -formoterol  (DULERA ) 100-5 MCG/ACT AERO [id number]  Inhale 2 puffs into the lungs 2 (two) times daily. Regalado, Belkys A, MD  Active   predniSONE  (DELTASONE ) 20 MG tablet [id number]  Take 2 tablets (40 mg total) by mouth daily for 5 days. Regalado, Belkys A,  MD  Active   sacubitril -valsartan  (ENTRESTO ) 24-26 MG [id number]  Take 1 tablet by mouth 2 (two) times daily. Regalado, Brigido Canales, MD  Active             Home Care and Equipment/Supplies: Were Home Health Services Ordered?: NA Any new equipment or medical supplies ordered?: NA  Functional Questionnaire: Do you need assistance with bathing/showering or dressing?: No Do you need assistance with meal preparation?: No Do you need assistance with eating?: No Do you have difficulty maintaining continence: No Do you need assistance with getting out of bed/getting out of a chair/moving?: No Do you have difficulty managing or taking your medications?: No  Follow up appointments reviewed: PCP Follow-up appointment confirmed?: Yes Date of PCP follow-up appointment?: 09/15/23 Follow-up Provider: [facility] Follow-up appointment confirmed?: Yes Date of Specialist follow-up appointment?: 09/04/23 Follow-Up Specialty Provider:: cardio Do you need transportation to your follow-up appointment?: No Do you understand care options if your condition(s) worsen?: Yes-patient verbalized understanding    SIGNATURE Darrall Ellison, LPN [facility] Nurse Health Advisor Direct Dial [phone]

## 2023-09-04 ENCOUNTER — Ambulatory Visit: Payer: Managed Care, Other (non HMO) | Admitting: General Practice

## 2023-09-15 ENCOUNTER — Inpatient Hospital Stay: Payer: Managed Care, Other (non HMO) | Admitting: Family Medicine

## 2023-09-25 NOTE — Progress Notes (Signed)
 Cardiology Office Note:  .   Date:  09/28/2023  ID:  Amy Kansky Sr., DOB 1960/12/04, MRN [id number] PCP: Aida House, MD  [facility] Providers Cardiologist:  Dr.Tulia  }   History of Present Illness: .   Thomas Patry Difranco Sr. is a 63 y.o. male we are seeing posthospitalization follow-up after presenting to [facility] with complaints of chest pain and shortness of breath.  CT chest was negative for pulmonary embolism he was found to have mild bronchitis and reactive airway disease.  He was transferred to [facility].  Coronary CTA dated 08/28/2023 revealed calcium score of 2.74 agonist and units.  He had minimal coronary artery disease with calcified plaque in the ostial left circumflex which was mild (1 to 24%) stenosis otherwise no plaque or stenosis in the remaining coronary arteries.  Echocardiogram revealed reduced EF of 40 to 45% with grade 1 diastolic dysfunction.  There was no significant valvular abnormalities.   He is here today with his wife without any cardiac complaints.  He has not gained any weight, he denies any dyspnea on exertion, fatigue, or palpitations.  He is medically compliant.  ROS: As above otherwise negative  Studies Reviewed: .    Echocardiogram 08/28/2023 1. Left ventricular ejection fraction, by estimation, is 40 to 45%. The  left ventricle has mildly decreased function. The left ventricle  demonstrates regional wall motion abnormalities (see scoring  diagram/findings for description). Left ventricular  diastolic parameters are consistent with Grade I diastolic dysfunction  (impaired relaxation).   2. Right ventricular systolic function is normal. The right ventricular  size is normal.   3. The mitral valve is normal in structure. No evidence of mitral valve  regurgitation. No evidence of mitral stenosis.   4. The aortic valve is tricuspid. There is mild calcification of the  aortic valve. Aortic valve regurgitation is  not visualized. No aortic  stenosis is present.   5. The inferior vena cava is normal in size with greater than 50%  respiratory variability, suggesting right atrial pressure of 3 mmHg.   EKG Interpretation Date/Time:  Monday September 28 2023 16:07:59 EST Ventricular Rate:  68 PR Interval:  162 QRS Duration:  100 QT Interval:  398 QTC Calculation: 423 R Axis:   33  Text Interpretation: Normal sinus rhythm Incomplete right bundle branch block Minimal voltage criteria for LVH, may be normal variant ( Cornell product ) Septal infarct , age undetermined When compared with ECG of 28-Aug-2023 07:01, PREVIOUS ECG IS PRESENT Confirmed by Friddie Jetty [phone]) on 09/28/2023 4:23:16 PM    Physical Exam:   VS:  BP 116/78 (BP Location: Left Arm, Patient Position: Sitting, Cuff Size: Normal)   Pulse 68   Ht 5\' 8"  (1.727 m)   Wt 166 lb 12.8 oz (75.7 kg)   SpO2 100%   BMI 25.36 kg/m    Wt Readings from Last 3 Encounters:  09/28/23 166 lb 12.8 oz (75.7 kg)  08/31/23 156 lb 1.4 oz (70.8 kg)  05/12/23 165 lb (74.8 kg)    GEN: Well nourished, well developed in no acute distress, thin NECK: No JVD; No carotid bruits CARDIAC: RRR, no murmurs, rubs, gallops RESPIRATORY:  Clear to auscultation without rales, wheezing or rhonchi  ABDOMEN: Soft, non-tender, non-distended EXTREMITIES:  No edema; No deformity   ASSESSMENT AND PLAN: .    Systolic CHF: Echo on 08/28/2023 EF of 40 to 45% with grade 1 diastolic dysfunction.  Remains on Entresto  24 to  26 mg twice daily.  Refills are provided.  Blood pressure 116/78.  I will not add SGLT or spironolactone at this time as I do not believe he will be able to handle a lower blood pressure.  He denies any evidence of volume overload away from the office.  He has been very strict on the sodium.  Will continue to follow him every 6 months unless he becomes symptomatic.  Ischemic workup is not recommended as he did have coronary CTA on 08/28/2023 with minimal  coronary artery disease.  2.  Moderate persistent asthma: Remains on inhalers and is followed by primary care.  Defer.         Signed, Conard Decent. Vallery Gavel, ANP, AACC

## 2023-09-28 ENCOUNTER — Ambulatory Visit: Payer: Managed Care, Other (non HMO) | Attending: Adult Health | Admitting: Adult Health

## 2023-09-28 ENCOUNTER — Encounter: Payer: Self-pay | Admitting: Adult Health

## 2023-09-28 VITALS — BP 116/78 | HR 68 | Ht 68.0 in | Wt 166.8 lb

## 2023-09-28 DIAGNOSIS — I5042 Chronic combined systolic (congestive) and diastolic (congestive) heart failure: Secondary | ICD-10-CM

## 2023-09-28 DIAGNOSIS — J4540 Moderate persistent asthma, uncomplicated: Secondary | ICD-10-CM | POA: Diagnosis not present

## 2023-09-28 DIAGNOSIS — I214 Non-ST elevation (NSTEMI) myocardial infarction: Secondary | ICD-10-CM

## 2023-09-28 MED ORDER — SACUBITRIL-VALSARTAN 24-26 MG PO TABS: 1.0000 | ORAL_TABLET | Freq: Two times a day (BID) | ORAL | 1 refills | Status: AC

## 2023-09-28 NOTE — Patient Instructions (Signed)
 Medication Instructions:  No Changes *If you need a refill on your cardiac medications before your next appointment, please call your pharmacy*   Lab Work: No labs If you have labs (blood work) drawn today and your tests are completely normal, you will receive your results only by: MyChart Message (if you have MyChart) OR A paper copy in the mail If you have any lab test that is abnormal or we need to change your treatment, we will call you to review the results.   Testing/Procedures: No Testing   Follow-Up: At [facility], you and your health needs are our priority.  As part of our continuing mission to provide you with exceptional heart care, we have created designated Provider Care Teams.  These Care Teams include your primary Cardiologist (physician) and Advanced Practice Providers (APPs -  Physician Assistants and Nurse Practitioners) who all work together to provide you with the care you need, when you need it.  We recommend signing up for the patient portal called "MyChart".  Sign up information is provided on this After Visit Summary.  MyChart is used to connect with patients for Virtual Visits (Telemedicine).  Patients are able to view lab/test results, encounter notes, upcoming appointments, etc.  Non-urgent messages can be sent to your provider as well.   To learn more about what you can do with MyChart, go to [email or web address].    Your next appointment:   6 month(s)  Provider:    Olinda Bertrand, DO

## 2023-10-19 ENCOUNTER — Ambulatory Visit: Payer: Managed Care, Other (non HMO) | Admitting: Family Medicine

## 2023-11-02 ENCOUNTER — Ambulatory Visit (INDEPENDENT_AMBULATORY_CARE_PROVIDER_SITE_OTHER): Admitting: Family Medicine

## 2023-11-02 ENCOUNTER — Encounter: Payer: Self-pay | Admitting: Family Medicine

## 2023-11-02 VITALS — BP 100/60 | HR 66 | Temp 97.6°F | Ht 68.25 in | Wt 164.9 lb

## 2023-11-02 DIAGNOSIS — Z1211 Encounter for screening for malignant neoplasm of colon: Secondary | ICD-10-CM | POA: Diagnosis not present

## 2023-11-02 DIAGNOSIS — Z0000 Encounter for general adult medical examination without abnormal findings: Secondary | ICD-10-CM

## 2023-11-02 DIAGNOSIS — J4540 Moderate persistent asthma, uncomplicated: Secondary | ICD-10-CM

## 2023-11-02 DIAGNOSIS — H1013 Acute atopic conjunctivitis, bilateral: Secondary | ICD-10-CM

## 2023-11-02 MED ORDER — MOMETASONE FURO-FORMOTEROL FUM 100-5 MCG/ACT IN AERO: 2.0000 | INHALATION_SPRAY | Freq: Two times a day (BID) | RESPIRATORY_TRACT | 0 refills | Status: AC

## 2023-11-02 MED ORDER — OLOPATADINE HCL 0.1 % OP SOLN: 1.0000 [drp] | Freq: Two times a day (BID) | OPHTHALMIC | 5 refills | Status: AC

## 2023-11-02 MED ORDER — IPRATROPIUM-ALBUTEROL 0.5-2.5 (3) MG/3ML IN SOLN: 3.0000 mL | Freq: Two times a day (BID) | RESPIRATORY_TRACT | 0 refills | Status: DC

## 2023-11-02 NOTE — Patient Instructions (Addendum)
 May go to any pharmacy in the area to receive the following vaccines:  Tetantus booster, Pneumonia vaccine, and the RSV vaccine.   Health Maintenance, Male Adopting a healthy lifestyle and getting preventive care are important in promoting health and wellness. Ask your health care provider about: The right schedule for you to have regular tests and exams. Things you can do on your own to prevent diseases and keep yourself healthy. What should I know about diet, weight, and exercise? Eat a healthy diet  Eat a diet that includes plenty of vegetables, fruits, low-fat dairy products, and lean protein. Do not eat a lot of foods that are high in solid fats, added sugars, or sodium. Maintain a healthy weight Body mass index (BMI) is a measurement that can be used to identify possible weight problems. It estimates body fat based on height and weight. Your health care provider can help determine your BMI and help you achieve or maintain a healthy weight. Get regular exercise Get regular exercise. This is one of the most important things you can do for your health. Most adults should: Exercise for at least 150 minutes each week. The exercise should increase your heart rate and make you sweat (moderate-intensity exercise). Do strengthening exercises at least twice a week. This is in addition to the moderate-intensity exercise. Spend less time sitting. Even light physical activity can be beneficial. Watch cholesterol and blood lipids Have your blood tested for lipids and cholesterol at 62 years of age, then have this test every 5 years. You may need to have your cholesterol levels checked more often if: Your lipid or cholesterol levels are high. You are older than 61 years of age. You are at high risk for heart disease. What should I know about cancer screening? Many types of cancers can be detected early and may often be prevented. Depending on your health history and family history, you may need to  have cancer screening at various ages. This may include screening for: Colorectal cancer. Prostate cancer. Skin cancer. Lung cancer. What should I know about heart disease, diabetes, and high blood pressure? Blood pressure and heart disease High blood pressure causes heart disease and increases the risk of stroke. This is more likely to develop in people who have high blood pressure readings or are overweight. Talk with your health care provider about your target blood pressure readings. Have your blood pressure checked: Every 3-5 years if you are 58-31 years of age. Every year if you are 75 years old or older. If you are between the ages of 68 and 31 and are a current or former smoker, ask your health care provider if you should have a one-time screening for abdominal aortic aneurysm (AAA). Diabetes Have regular diabetes screenings. This checks your fasting blood sugar level. Have the screening done: Once every three years after age 34 if you are at a normal weight and have a low risk for diabetes. More often and at a younger age if you are overweight or have a high risk for diabetes. What should I know about preventing infection? Hepatitis B If you have a higher risk for hepatitis B, you should be screened for this virus. Talk with your health care provider to find out if you are at risk for hepatitis B infection. Hepatitis C Blood testing is recommended for: Everyone born from [birth year] through 1964. Anyone with known risk factors for hepatitis C. Sexually transmitted infections (STIs) You should be screened each year for STIs, including gonorrhea and  chlamydia, if: You are sexually active and are younger than 62 years of age. You are older than 62 years of age and your health care provider tells you that you are at risk for this type of infection. Your sexual activity has changed since you were last screened, and you are at increased risk for chlamydia or gonorrhea. Ask your health care  provider if you are at risk. Ask your health care provider about whether you are at high risk for HIV. Your health care provider may recommend a prescription medicine to help prevent HIV infection. If you choose to take medicine to prevent HIV, you should first get tested for HIV. You should then be tested every 3 months for as long as you are taking the medicine. Follow these instructions at home: Alcohol use Do not drink alcohol if your health care provider tells you not to drink. If you drink alcohol: Limit how much you have to 0-2 drinks a day. Know how much alcohol is in your drink. In the [place] one drink equals one 12 oz bottle of beer (355 mL), one 5 oz glass of wine (148 mL), or one 1 oz glass of hard liquor (44 mL). Lifestyle Do not use any products that contain nicotine or tobacco. These products include cigarettes, chewing tobacco, and vaping devices, such as e-cigarettes. If you need help quitting, ask your health care provider. Do not use street drugs. Do not share needles. Ask your health care provider for help if you need support or information about quitting drugs. General instructions Schedule regular health, dental, and eye exams. Stay current with your vaccines. Tell your health care provider if: You often feel depressed. You have ever been abused or do not feel safe at home. Summary Adopting a healthy lifestyle and getting preventive care are important in promoting health and wellness. Follow your health care provider's instructions about healthy diet, exercising, and getting tested or screened for diseases. Follow your health care provider's instructions on monitoring your cholesterol and blood pressure. This information is not intended to replace advice given to you by your health care provider. Make sure you discuss any questions you have with your health care provider. Document Revised: 12/24/2020 Document Reviewed: 12/24/2020 Elsevier Patient Education  2024  [employer].

## 2023-11-02 NOTE — Progress Notes (Unsigned)
 Complete physical exam  Patient: Thomas Chelf Sr.   DOB: 06-06-1961   63 y.o. Male  MRN: [id number]  Subjective:    Chief Complaint  Patient presents with   Annual Exam    Thomas Robson Sr. is a 63 y.o. male who presents today for a complete physical exam. He reports consuming a low sodium diet. Doesn't drink mild, no vitamins Supplements. The patient has a physically strenuous job, but has no regular exercise apart from work.  He generally feels well. He reports sleeping well. He does not have additional problems to discuss today.    Most recent fall risk assessment:     No data to display           Most recent depression screenings:    11/02/2023    2:51 PM  PHQ 2/9 Scores  PHQ - 2 Score 0  PHQ- 9 Score 0    Vision:Within last year and Dental: No current dental problems and Last dental visit: 1 year ago, needs a new dentist  Patient Active Problem List   Diagnosis Date Noted   ACS (acute coronary syndrome) (HCC) 08/28/2023   Shortness of breath 08/28/2023   Non-ST elevation (NSTEMI) myocardial infarction (HCC) 08/28/2023   Demand ischemia (HCC) 08/28/2023   Precordial pain 08/28/2023   Troponin level elevated 08/28/2023   Acute cough 08/28/2023   Exacerbation of asthma 08/28/2023   Elevated blood pressure reading 08/28/2023   Moderate persistent asthma 10/14/2022   Acute bacterial conjunctivitis of both eyes 10/14/2022   Anemia 10/14/2022   Urticarial rash 12/15/2012   Oral thrush 08/30/2012   Asthma 07/28/2012      Patient Care Team: Karie Georges, MD as PCP - General (Family Medicine)   Outpatient Medications Prior to Visit  Medication Sig   albuterol (PROVENTIL HFA) 108 (90 Base) MCG/ACT inhaler Inhale 2 puffs into the lungs every 4 (four) hours as needed for wheezing or shortness of breath.   docusate sodium (COLACE) 100 MG capsule Take 1 capsule (100 mg total) by mouth 2 (two) times daily.   sacubitril-valsartan (ENTRESTO) 24-26 MG  Take 1 tablet by mouth 2 (two) times daily.   [DISCONTINUED] ipratropium-albuterol (DUONEB) 0.5-2.5 (3) MG/3ML SOLN Take 3 mLs by nebulization 2 (two) times daily.   [DISCONTINUED] mometasone-formoterol (DULERA) 100-5 MCG/ACT AERO Inhale 2 puffs into the lungs 2 (two) times daily.   No facility-administered medications prior to visit.    Review of Systems  HENT:  Negative for hearing loss.   Eyes:  Negative for blurred vision.  Respiratory:  Negative for shortness of breath.   Cardiovascular:  Negative for chest pain.  Gastrointestinal: Negative.   Genitourinary: Negative.   Musculoskeletal:  Negative for back pain.  Neurological:  Negative for headaches.  Psychiatric/Behavioral:  Negative for depression.        Objective:     BP 100/60   Pulse 66   Temp 97.6 F (36.4 C) (Oral)   Ht 5' 8.25" (1.734 m)   Wt 164 lb 14.4 oz (74.8 kg)   SpO2 98%   BMI 24.89 kg/m  {Vitals History (Optional):23777}  Physical Exam Vitals reviewed.  Constitutional:      Appearance: Normal appearance. He is well-groomed and normal weight.  HENT:     Right Ear: Tympanic membrane and ear canal normal.     Left Ear: Tympanic membrane and ear canal normal.     Mouth/Throat:     Mouth: Mucous membranes are moist.  Pharynx: No posterior oropharyngeal erythema.  Eyes:     Extraocular Movements: Extraocular movements intact.     Conjunctiva/sclera: Conjunctivae normal.  Neck:     Thyroid: No thyromegaly.  Cardiovascular:     Rate and Rhythm: Normal rate and regular rhythm.     Heart sounds: S1 normal and S2 normal. No murmur heard. Pulmonary:     Effort: Pulmonary effort is normal.     Breath sounds: Normal breath sounds and air entry. No rales.  Abdominal:     General: Abdomen is flat. Bowel sounds are normal.  Musculoskeletal:     Right lower leg: No edema.     Left lower leg: No edema.  Lymphadenopathy:     Cervical: No cervical adenopathy.  Neurological:     General: No focal  deficit present.     Mental Status: He is alert and oriented to person, place, and time.     Gait: Gait is intact.  Psychiatric:        Mood and Affect: Mood and affect normal.      No results found for any visits on 11/02/23. {Show previous labs (optional):23779}    Assessment & Plan:    Routine Health Maintenance and Physical Exam  There is no immunization history for the selected administration types on file for this patient.  Health Maintenance  Topic Date Due   Pneumococcal Vaccine 58-13 Years old (1 of 2 - PCV) Never done   DTaP/Tdap/Td (1 - Tdap) Never done   Colonoscopy  Never done   Zoster Vaccines- Shingrix (1 of 2) Never done   INFLUENZA VACCINE  Never done   COVID-19 Vaccine (1 - 2024-25 season) Never done   Hepatitis C Screening  11/01/2024 (Originally 10/29/1978)   HIV Screening  Completed   HPV VACCINES  Aged Out    Discussed health benefits of physical activity, and encouraged him to engage in regular exercise appropriate for his age and condition.  Moderate persistent asthma without complication -     Ipratropium-Albuterol; Take 3 mLs by nebulization 2 (two) times daily.  Dispense: 180 mL; Refill: 0 -     Mometasone Furo-Formoterol Fum; Inhale 2 puffs into the lungs 2 (two) times daily.  Dispense: 1 each; Refill: 0  Allergic conjunctivitis of both eyes -     Olopatadine HCl; Place 1 drop into both eyes 2 (two) times daily.  Dispense: 5 mL; Refill: 5  Routine general medical examination at a health care facility -     Comprehensive metabolic panel; Future  Colon cancer screening -     Ambulatory referral to Gastroenterology    Return in 6 months (on 05/04/2024).     Karie Georges, MD

## 2023-11-03 LAB — COMPREHENSIVE METABOLIC PANEL
ALT: 11 U/L (ref 0–53)
AST: 21 U/L (ref 0–37)
Albumin: 4.1 g/dL (ref 3.5–5.2)
Alkaline Phosphatase: 46 U/L (ref 39–117)
BUN: 11 mg/dL (ref 6–23)
CO2: 27 meq/L (ref 19–32)
Calcium: 9.6 mg/dL (ref 8.4–10.5)
Chloride: 103 meq/L (ref 96–112)
Creatinine, Ser: 1.1 mg/dL (ref 0.40–1.50)
GFR: 72.2 mL/min (ref >60.00)
Glucose, Bld: 64 mg/dL — ABNORMAL LOW (ref 70–99)
Potassium: 4.2 meq/L (ref 3.5–5.1)
Sodium: 136 meq/L (ref 135–145)
Total Bilirubin: 0.6 mg/dL (ref 0.2–1.2)
Total Protein: 8.1 g/dL (ref 6.0–8.3)

## 2023-11-26 ENCOUNTER — Ambulatory Visit: Payer: Managed Care, Other (non HMO) | Admitting: Nurse Practitioner

## 2023-12-14 ENCOUNTER — Other Ambulatory Visit: Payer: Self-pay | Admitting: Family Medicine

## 2023-12-14 DIAGNOSIS — J4540 Moderate persistent asthma, uncomplicated: Secondary | ICD-10-CM

## 2024-01-06 ENCOUNTER — Encounter: Payer: Self-pay | Admitting: Family Medicine

## 2024-02-17 IMAGING — DX DG CHEST 2V
2 series · 2 of 2 positions shown · non-contrast
Comparison: 04/23/2021 chest radiograph.

CLINICAL DATA: Dyspnea

EXAM:
CHEST - 2 VIEW

[chest pa]
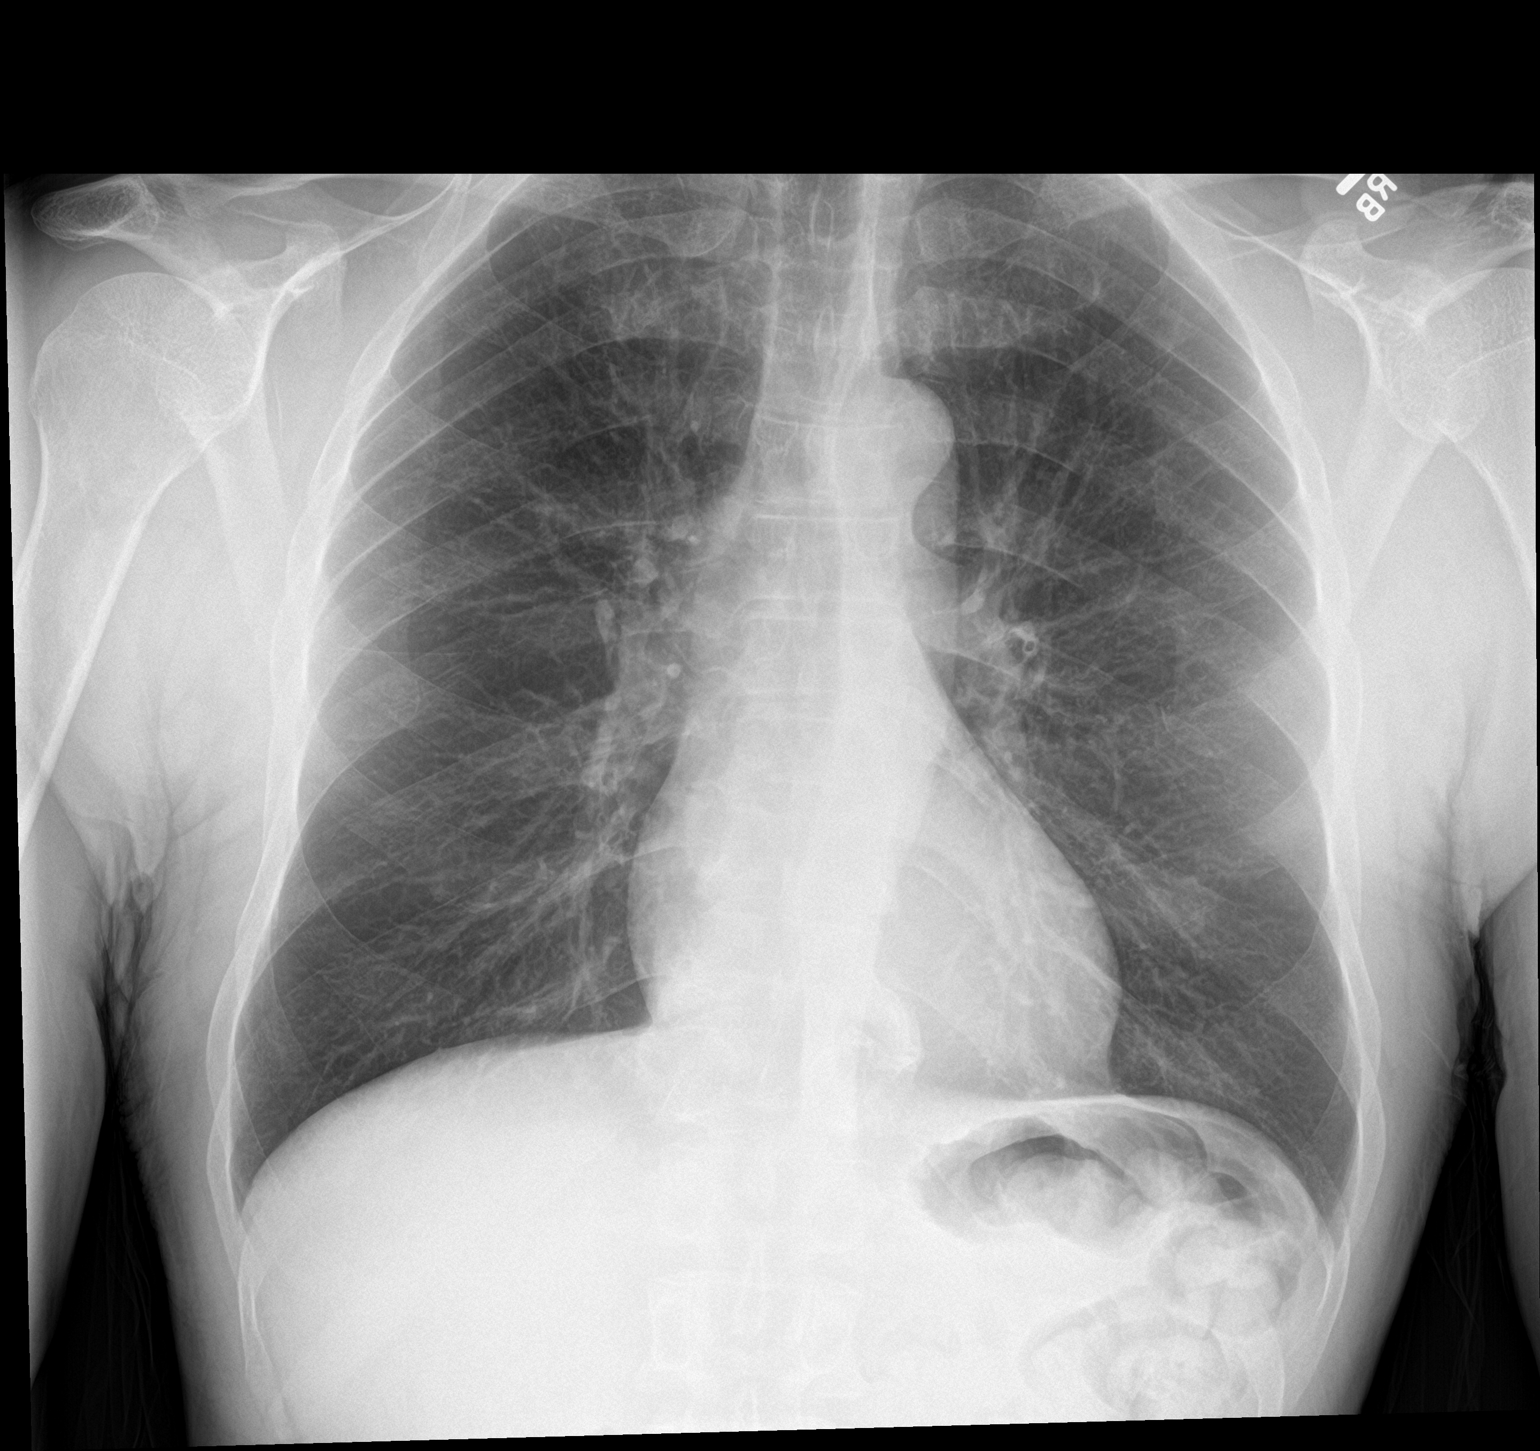

[chest lat]
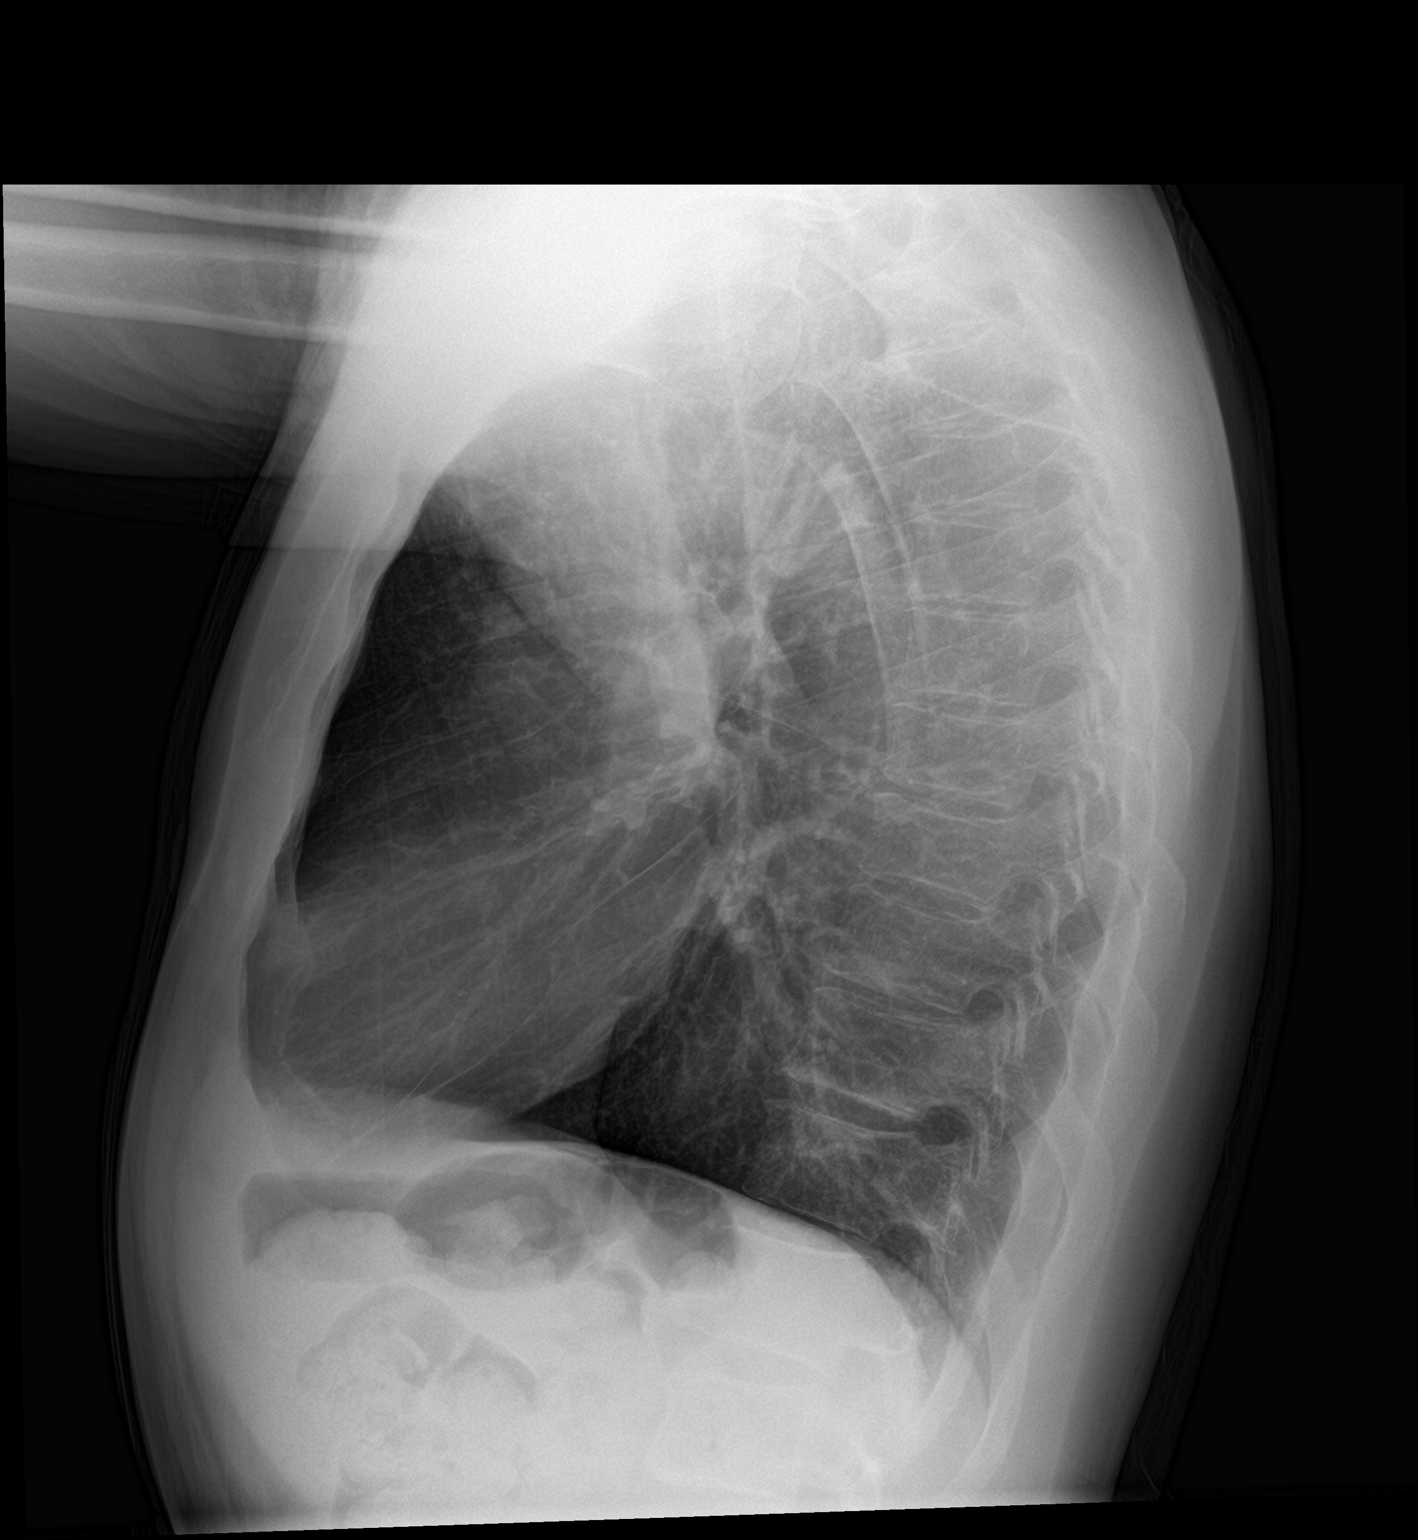

[2 of 2 positions shown; findings below may reference images not displayed]

FINDINGS: Stable cardiomediastinal silhouette with normal heart size. No
pneumothorax. No pleural effusion. Lungs appear clear, with no acute
consolidative airspace disease and no pulmonary edema.
IMPRESSION: No active cardiopulmonary disease.

## 2024-04-20 ENCOUNTER — Telehealth: Payer: Self-pay | Admitting: Family Medicine

## 2024-04-20 NOTE — Telephone Encounter (Unsigned)
 Copied from CRM #[id number]. Topic: Clinical - Medication Refill >> Apr 20, 2024  1:58 PM Sasha M wrote: Medication: albuterol  (PROVENTIL  HFA) 108 (90 Base) MCG/ACT inhaler  Has the patient contacted their pharmacy? No (Agent: If no, request that the patient contact the pharmacy for the refill. If patient does not wish to contact the pharmacy document the reason why and proceed with request.) (Agent: If yes, when and what did the pharmacy advise?)  This is the patient's preferred pharmacy:  [id number] [address], [place] - [address] [place]  [address] Phone: [phone] Fax: [phone]  Is this the correct pharmacy for this prescription? Yes If no, delete pharmacy and type the correct one.   Has the prescription been filled recently? No  Is the patient out of the medication? Yes  Has the patient been seen for an appointment in the last year OR does the patient have an upcoming appointment? Yes  Can we respond through MyChart? No  Agent: Please be advised that Rx refills may take up to 3 business days. We ask that you follow-up with your pharmacy.

## 2024-04-27 ENCOUNTER — Other Ambulatory Visit: Payer: Self-pay | Admitting: Family Medicine

## 2024-04-27 DIAGNOSIS — J4540 Moderate persistent asthma, uncomplicated: Secondary | ICD-10-CM

## 2024-05-05 ENCOUNTER — Ambulatory Visit: Payer: Self-pay | Admitting: Family Medicine

## 2024-07-20 DIAGNOSIS — D709 Neutropenia, unspecified: Secondary | ICD-10-CM | POA: Diagnosis not present

## 2024-07-21 DIAGNOSIS — L209 Atopic dermatitis, unspecified: Secondary | ICD-10-CM | POA: Diagnosis not present

## 2024-07-25 ENCOUNTER — Telehealth: Payer: Self-pay | Admitting: Family Medicine

## 2024-07-25 DIAGNOSIS — J4540 Moderate persistent asthma, uncomplicated: Secondary | ICD-10-CM

## 2024-07-25 NOTE — Telephone Encounter (Unsigned)
 Copied from CRM [id number]. Topic: Clinical - Medication Refill >> Jul 25, 2024  1:27 PM Suzen RAMAN wrote: Medication:albuterol  (PROVENTIL  HFA) 108 (90 Base) MCG/ACT inhaler  ***  Has the patient contacted their pharmacy? Yes   This is the patient's preferred pharmacy:  [id number] [address], [place] - [address] [place] [address] Phone: [phone] Fax: [phone]  Is this the correct pharmacy for this prescription? Yes If no, delete pharmacy and type the correct one.   Has the prescription been filled recently? No  Is the patient out of the medication? Yes  Has the patient been seen for an appointment in the last year OR does the patient have an upcoming appointment? Yes  Can we respond through MyChart? No  Agent: Please be advised that Rx refills may take up to 3 business days. We ask that you follow-up with your pharmacy.

## 2024-07-28 MED ORDER — ALBUTEROL SULFATE HFA 108 (90 BASE) MCG/ACT IN AERS: 2.0000 | INHALATION_SPRAY | RESPIRATORY_TRACT | 2 refills | Status: AC | PRN

## 2024-08-25 ENCOUNTER — Other Ambulatory Visit: Payer: Self-pay | Admitting: Family Medicine

## 2024-08-25 DIAGNOSIS — J4540 Moderate persistent asthma, uncomplicated: Secondary | ICD-10-CM

## 2024-08-25 NOTE — Telephone Encounter (Signed)
 Copied from CRM [id number]. Topic: Clinical - Medication Refill >> Aug 25, 2024  3:43 PM Thersia C wrote: Medication: ipratropium-albuterol  (DUONEB) 0.5-2.5 (3) MG/3ML SOLN  Has the patient contacted their pharmacy? Yes (Agent: If no, request that the patient contact the pharmacy for the refill. If patient does not wish to contact the pharmacy document the reason why and proceed with request.) (Agent: If yes, when and what did the pharmacy advise?)  This is the patient's preferred pharmacy:  [id number] [address], [place] - [address] [place] [address] Phone: [phone] Fax: [phone]  Is this the correct pharmacy for this prescription? Yes If no, delete pharmacy and type the correct one.   Has the prescription been filled recently? No  Is the patient out of the medication? Yes  Has the patient been seen for an appointment in the last year OR does the patient have an upcoming appointment? Yes  Can we respond through MyChart? Yes  Agent: Please be advised that Rx refills may take up to 3 business days. We ask that you follow-up with your pharmacy.

## 2024-09-22 ENCOUNTER — Other Ambulatory Visit: Payer: Self-pay | Admitting: Family Medicine

## 2024-09-22 DIAGNOSIS — J4540 Moderate persistent asthma, uncomplicated: Secondary | ICD-10-CM
